# Patient Record
Sex: Male | Born: 1975 | Race: Black or African American | Hispanic: No | Marital: Single | Smoking: Current some day smoker
Health system: Southern US, Community
[De-identification: ages and names within clinical notes are randomized; demographics above are authoritative.]

---

## 2001-05-18 ENCOUNTER — Emergency Department (HOSPITAL_COMMUNITY): Admission: EM | Admit: 2001-05-18 | Discharge: 2001-05-18 | Payer: Self-pay | Admitting: Emergency Medicine

## 2001-09-09 ENCOUNTER — Emergency Department (HOSPITAL_COMMUNITY): Admission: EM | Admit: 2001-09-09 | Discharge: 2001-09-09 | Payer: Self-pay | Admitting: *Deleted

## 2001-09-10 ENCOUNTER — Emergency Department (HOSPITAL_COMMUNITY): Admission: EM | Admit: 2001-09-10 | Discharge: 2001-09-10 | Payer: Self-pay | Admitting: Emergency Medicine

## 2006-05-07 ENCOUNTER — Emergency Department (HOSPITAL_COMMUNITY): Admission: EM | Admit: 2006-05-07 | Discharge: 2006-05-07 | Payer: Self-pay | Admitting: Emergency Medicine

## 2006-10-17 ENCOUNTER — Emergency Department (HOSPITAL_COMMUNITY): Admission: EM | Admit: 2006-10-17 | Discharge: 2006-10-17 | Payer: Self-pay | Admitting: Emergency Medicine

## 2007-04-04 ENCOUNTER — Emergency Department (HOSPITAL_COMMUNITY): Admission: EM | Admit: 2007-04-04 | Discharge: 2007-04-04 | Payer: Self-pay | Admitting: Emergency Medicine

## 2008-10-23 ENCOUNTER — Emergency Department (HOSPITAL_COMMUNITY): Admission: EM | Admit: 2008-10-23 | Discharge: 2008-10-24 | Payer: Self-pay | Admitting: Emergency Medicine

## 2009-12-12 ENCOUNTER — Telehealth: Payer: Self-pay | Admitting: Internal Medicine

## 2010-02-06 NOTE — Progress Notes (Signed)
Summary: NO CALL NO SHOW new pt to establish appt.   Call back at no call no show       Additional Follow-up for Phone Call Additional follow up Details #2::    note sent to Dr. Amador Cunas r/t [facility] new pt to establish .  per him - please charge fee. kik Follow-up by: Duard Brady LPN,  December 12, 2009 2:56 PM

## 2014-12-20 ENCOUNTER — Encounter (HOSPITAL_COMMUNITY): Payer: Self-pay | Admitting: Neurology

## 2014-12-20 ENCOUNTER — Emergency Department (HOSPITAL_COMMUNITY)
Admission: EM | Admit: 2014-12-20 | Discharge: 2014-12-20 | Disposition: A | Discharge status: Home / Self Care | Payer: Self-pay | Attending: Emergency Medicine | Admitting: Emergency Medicine

## 2014-12-20 DIAGNOSIS — Z79899 Other long term (current) drug therapy: Secondary | ICD-10-CM | POA: Insufficient documentation

## 2014-12-20 DIAGNOSIS — R109 Unspecified abdominal pain: Secondary | ICD-10-CM | POA: Insufficient documentation

## 2014-12-20 DIAGNOSIS — F17200 Nicotine dependence, unspecified, uncomplicated: Secondary | ICD-10-CM | POA: Insufficient documentation

## 2014-12-20 LAB — COMPREHENSIVE METABOLIC PANEL
ALBUMIN: 3.9 g/dL (ref 3.5–5.0)
ALT: 26 U/L (ref 17–63)
AST: 34 U/L (ref 15–41)
Alkaline Phosphatase: 70 U/L (ref 38–126)
Anion gap: 10 (ref 5–15)
BUN: 7 mg/dL (ref 6–20)
CHLORIDE: 104 mmol/L (ref 101–111)
CO2: 24 mmol/L (ref 22–32)
CREATININE: 0.85 mg/dL (ref 0.61–1.24)
Calcium: 9.2 mg/dL (ref 8.9–10.3)
GFR calc Af Amer: >60 mL/min (ref >60)
GLUCOSE: 110 mg/dL — AB (ref 65–99)
Potassium: 3.7 mmol/L (ref 3.5–5.1)
Sodium: 138 mmol/L (ref 135–145)
Total Bilirubin: 0.7 mg/dL (ref 0.3–1.2)
Total Protein: 6.8 g/dL (ref 6.5–8.1)

## 2014-12-20 LAB — URINE MICROSCOPIC-ADD ON
Bacteria, UA: NONE SEEN
Squamous Epithelial / LPF: NONE SEEN

## 2014-12-20 LAB — URINALYSIS, ROUTINE W REFLEX MICROSCOPIC
GLUCOSE, UA: NEGATIVE mg/dL
KETONES UR: NEGATIVE mg/dL
Leukocytes, UA: NEGATIVE
Nitrite: NEGATIVE
PH: 5 (ref 5.0–8.0)
PROTEIN: NEGATIVE mg/dL
Specific Gravity, Urine: 1.023 (ref 1.005–1.030)

## 2014-12-20 LAB — CBC
HEMATOCRIT: 41 % (ref 39.0–52.0)
Hemoglobin: 13.6 g/dL (ref 13.0–17.0)
MCH: 29.2 pg (ref 26.0–34.0)
MCHC: 33.2 g/dL (ref 30.0–36.0)
MCV: 88.2 fL (ref 78.0–100.0)
PLATELETS: 218 10*3/uL (ref 150–400)
RBC: 4.65 MIL/uL (ref 4.22–5.81)
RDW: 12 % (ref 11.5–15.5)
WBC: 4.3 10*3/uL (ref 4.0–10.5)

## 2014-12-20 MED ORDER — TAMSULOSIN HCL 0.4 MG PO CAPS: 0.4000 mg | ORAL_CAPSULE | Freq: Two times a day (BID) | ORAL | Status: DC

## 2014-12-20 MED ORDER — HYDROCODONE-ACETAMINOPHEN 5-325 MG PO TABS: 1.0000 | ORAL_TABLET | ORAL | Status: DC | PRN

## 2014-12-20 NOTE — ED Provider Notes (Signed)
CSN: [id number]     Arrival date & time 12/20/14  1014 History   First MD Initiated Contact with Patient 12/20/14 1047     Chief Complaint  Patient presents with  . Flank Pain     (Consider location/radiation/quality/duration/timing/severity/associated sxs/prior Treatment) HPI Donald Cherry is a 39 y.o. male who comes in for evaluation of right flank pain. Patient reports on Saturday he was lying on the driveway, changing the oil in his car when he tried to get up and noticed a mild discomfort in his right flank. He reports it has gradually worsened over the weekend until today. He is not taking anything to improve his symptoms. Symptoms are exacerbated with rotational movements. He denies any fevers, chills, nausea or vomiting, urinary symptoms. No history of kidney stone  History reviewed. No pertinent past medical history. History reviewed. No pertinent past surgical history. No family history on file. Social History  Substance Use Topics  . Smoking status: Current Every Day Smoker  . Smokeless tobacco: None  . Alcohol Use: Yes    Review of Systems A 10 point review of systems was completed and was negative except for pertinent positives and negatives as mentioned in the history of present illness     Allergies  Milk-related compounds  Home Medications   Prior to Admission medications   Medication Sig Start Date End Date Taking? Authorizing Provider  calcium carbonate (TUMS - DOSED IN MG ELEMENTAL CALCIUM) 500 MG chewable tablet Chew 2 tablets by mouth daily.   Yes Historical Provider, MD  HYDROcodone-acetaminophen (NORCO/VICODIN) 5-325 MG tablet Take 1-2 tablets by mouth every 4 (four) hours as needed. 12/20/14   [name], PA-C  tamsulosin (FLOMAX) 0.4 MG CAPS capsule Take 1 capsule (0.4 mg total) by mouth 2 (two) times daily. 12/20/14   Tore Carreker, PA-C   BP 149/104 mmHg  Pulse 76  Temp(Src) 97.7 F (36.5 C) (Oral)  Resp 16  SpO2 100% Physical Exam   Constitutional: He is oriented to person, place, and time. He appears well-developed and well-nourished.  HENT:  Head: Normocephalic and atraumatic.  Mouth/Throat: Oropharynx is clear and moist.  Eyes: Conjunctivae are normal. Pupils are equal, round, and reactive to light. Right eye exhibits no discharge. Left eye exhibits no discharge. No scleral icterus.  Neck: Neck supple.  Cardiovascular: Normal rate, regular rhythm and normal heart sounds.   Pulmonary/Chest: Effort normal and breath sounds normal. No respiratory distress. He has no wheezes. He has no rales.  Abdominal: Soft. There is no tenderness.  Musculoskeletal: He exhibits no tenderness.  No lesions or deformities to right flank. No tenderness to palpation. No crepitus or bony abnormalities. Discomfort reproduced with trunk rotation.  Neurological: He is alert and oriented to person, place, and time.  Cranial Nerves II-XII grossly intact  Skin: Skin is warm and dry. No rash noted.  Psychiatric: He has a normal mood and affect.  Nursing note and vitals reviewed.   ED Course  Procedures (including critical care time) Labs Review Labs Reviewed  COMPREHENSIVE METABOLIC PANEL - Abnormal; Notable for the following:    Glucose, Bld 110 (*)    All other components within normal limits  URINALYSIS, ROUTINE W REFLEX MICROSCOPIC (NOT AT [facility]) - Abnormal; Notable for the following:    Color, Urine AMBER (*)    Hgb urine dipstick TRACE (*)    Bilirubin Urine SMALL (*)    All other components within normal limits  CBC  URINE MICROSCOPIC-ADD ON    Imaging  Review No results found. I have personally reviewed and evaluated these images and lab results as part of my medical decision-making.   EKG Interpretation None     Meds given in ED:  Medications - No data to display  Discharge Medication List as of 12/20/2014 12:35 PM    START taking these medications   Details  HYDROcodone-acetaminophen (NORCO/VICODIN) 5-325 MG  tablet Take 1-2 tablets by mouth every 4 (four) hours as needed., Starting 12/20/2014, Until Discontinued, Print    tamsulosin (FLOMAX) 0.4 MG CAPS capsule Take 1 capsule (0.4 mg total) by mouth 2 (two) times daily., Starting 12/20/2014, Until Discontinued, Print       Filed Vitals:   12/20/14 1031  BP: 149/104  Pulse: 76  Temp: 97.7 F (36.5 C)  TempSrc: Oral  Resp: 16  SpO2: 100%    MDM  Donald Cherry is a 39 y.o. male with no syncope past medical history who comes in for evaluation of right flank pain. Patient thinks he may have injured his right flank while working on his car on the driveway. Discomfort is worse with certain rotational movements. He denies any urinary symptoms. However, urinalysis shows trace hemoglobin. No evidence of UTI or pyelonephritis. Will treat for possible stone versus MSK injury with short course pain medicines. Also given Flomax and instructions to follow-up with PCP next week. Return to ED for any new or worsening symptoms including fevers, chills, nausea or vomiting, worsening pain or urinary symptoms. Patient agrees with this plan.  The patient appears reasonably screened and/or stabilized for discharge and I doubt any other medical condition or other [facility] requiring further screening, evaluation, or treatment in the ED at this time prior to discharge.   Final diagnoses:  Flank pain        [name], PA-C 12/20/14 [place], MD 12/21/14 [phone]

## 2014-12-20 NOTE — ED Notes (Signed)
Pt reports right flank pain since Saturday. Pain when moving or twisting. Denies n/v/d. Denies urinary s/s.

## 2014-12-20 NOTE — Discharge Instructions (Signed)
There does not appear to be an emergent cause for your symptoms. There was some blood in your urine and this may be the result of a kidney stone. There may also be no kidney stone and this may be musculoskeletal in nature. Please take your medications as prescribed. Follow-up with your doctor next week as needed. Return to ED for any new or worsening symptoms.  Flank Pain Flank pain refers to pain that is located on the side of the body between the upper abdomen and the back. The pain may occur over a short period of time (acute) or may be long-term or reoccurring (chronic). It may be mild or severe. Flank pain can be caused by many things. CAUSES  Some of the more common causes of flank pain include:  Muscle strains.   Muscle spasms.   A disease of your spine (vertebral disk disease).   A lung infection (pneumonia).   Fluid around your lungs (pulmonary edema).   A kidney infection.   Kidney stones.   A very painful skin rash caused by the chickenpox virus (shingles).   Gallbladder disease.  [place] care will depend on the cause of your pain. In general,  Rest as directed by your caregiver.  Drink enough fluids to keep your urine clear or pale yellow.  Only take over-the-counter or prescription medicines as directed by your caregiver. Some medicines may help relieve the pain.  Tell your caregiver about any changes in your pain.  Follow up with your caregiver as directed. SEEK IMMEDIATE MEDICAL CARE IF:   Your pain is not controlled with medicine.   You have new or worsening symptoms.  Your pain increases.   You have abdominal pain.   You have shortness of breath.   You have persistent nausea or vomiting.   You have swelling in your abdomen.   You feel faint or pass out.   You have blood in your urine.  You have a fever or persistent symptoms for more than 2-3 days.  You have a fever and your symptoms suddenly get  worse. MAKE SURE YOU:   Understand these instructions.  Will watch your condition.  Will get help right away if you are not doing well or get worse.   This information is not intended to replace advice given to you by your health care provider. Make sure you discuss any questions you have with your health care provider.   Document Released: 02/14/2005 Document Revised: 09/18/2011 Document Reviewed: 08/08/2011 Elsevier Interactive Patient Education [employer].

## 2015-10-23 ENCOUNTER — Emergency Department (HOSPITAL_COMMUNITY): Payer: Managed Care, Other (non HMO)

## 2015-10-23 ENCOUNTER — Emergency Department (HOSPITAL_COMMUNITY)
Admission: EM | Admit: 2015-10-23 | Discharge: 2015-10-23 | Disposition: A | Discharge status: Home / Self Care | Payer: Managed Care, Other (non HMO) | Attending: Emergency Medicine | Admitting: Emergency Medicine

## 2015-10-23 ENCOUNTER — Encounter (HOSPITAL_COMMUNITY): Payer: Self-pay | Admitting: Emergency Medicine

## 2015-10-23 DIAGNOSIS — F17200 Nicotine dependence, unspecified, uncomplicated: Secondary | ICD-10-CM | POA: Diagnosis not present

## 2015-10-23 DIAGNOSIS — B9789 Other viral agents as the cause of diseases classified elsewhere: Secondary | ICD-10-CM

## 2015-10-23 DIAGNOSIS — R062 Wheezing: Secondary | ICD-10-CM

## 2015-10-23 DIAGNOSIS — R05 Cough: Secondary | ICD-10-CM | POA: Diagnosis present

## 2015-10-23 DIAGNOSIS — J069 Acute upper respiratory infection, unspecified: Secondary | ICD-10-CM | POA: Insufficient documentation

## 2015-10-23 LAB — CBC WITH DIFFERENTIAL/PLATELET
BASOS PCT: 0 %
Basophils Absolute: 0 10*3/uL (ref 0.0–0.1)
EOS ABS: 0 10*3/uL (ref 0.0–0.7)
EOS PCT: 0 %
HCT: 43.2 % (ref 39.0–52.0)
HEMOGLOBIN: 14.9 g/dL (ref 13.0–17.0)
LYMPHS ABS: 1.8 10*3/uL (ref 0.7–4.0)
Lymphocytes Relative: 11 %
MCH: 29.8 pg (ref 26.0–34.0)
MCHC: 34.5 g/dL (ref 30.0–36.0)
MCV: 86.4 fL (ref 78.0–100.0)
MONOS PCT: 10 %
Monocytes Absolute: 1.6 10*3/uL — ABNORMAL HIGH (ref 0.1–1.0)
NEUTROS PCT: 79 %
Neutro Abs: 12.9 10*3/uL — ABNORMAL HIGH (ref 1.7–7.7)
PLATELETS: 251 10*3/uL (ref 150–400)
RBC: 5 MIL/uL (ref 4.22–5.81)
RDW: 12.2 % (ref 11.5–15.5)
WBC: 16.3 10*3/uL — ABNORMAL HIGH (ref 4.0–10.5)

## 2015-10-23 LAB — COMPREHENSIVE METABOLIC PANEL
ALBUMIN: 3.9 g/dL (ref 3.5–5.0)
ALK PHOS: 103 U/L (ref 38–126)
ALT: 36 U/L (ref 17–63)
ANION GAP: 11 (ref 5–15)
AST: 46 U/L — ABNORMAL HIGH (ref 15–41)
BUN: 8 mg/dL (ref 6–20)
CALCIUM: 9.6 mg/dL (ref 8.9–10.3)
CHLORIDE: 100 mmol/L — AB (ref 101–111)
CO2: 24 mmol/L (ref 22–32)
Creatinine, Ser: 0.94 mg/dL (ref 0.61–1.24)
GFR calc non Af Amer: >60 mL/min (ref >60)
GLUCOSE: 135 mg/dL — AB (ref 65–99)
POTASSIUM: 3.9 mmol/L (ref 3.5–5.1)
SODIUM: 135 mmol/L (ref 135–145)
Total Bilirubin: 1 mg/dL (ref 0.3–1.2)
Total Protein: 8 g/dL (ref 6.5–8.1)

## 2015-10-23 MED ORDER — AEROCHAMBER PLUS W/MASK MISC
1.0000 | Freq: Once | Status: DC
  Filled 2015-10-23 (×3): qty 1

## 2015-10-23 MED ORDER — DM-GUAIFENESIN ER 30-600 MG PO TB12
1.0000 | ORAL_TABLET | Freq: Two times a day (BID) | ORAL | Status: DC
Start: 2015-10-23 — End: 2015-10-24
  Administered 2015-10-23: 1 via ORAL
  Filled 2015-10-23: qty 1

## 2015-10-23 MED ORDER — PREDNISONE 20 MG PO TABS
60.0000 mg | ORAL_TABLET | Freq: Once | ORAL | Status: AC
  Administered 2015-10-23: 60 mg via ORAL
  Filled 2015-10-23: qty 3

## 2015-10-23 MED ORDER — IPRATROPIUM BROMIDE 0.02 % IN SOLN
0.5000 mg | Freq: Once | RESPIRATORY_TRACT | Status: AC
  Administered 2015-10-23: 0.5 mg via RESPIRATORY_TRACT
  Filled 2015-10-23: qty 2.5

## 2015-10-23 MED ORDER — ALBUTEROL SULFATE HFA 108 (90 BASE) MCG/ACT IN AERS
2.0000 | INHALATION_SPRAY | Freq: Once | RESPIRATORY_TRACT | Status: AC
  Administered 2015-10-23: 2 via RESPIRATORY_TRACT
  Filled 2015-10-23: qty 6.7

## 2015-10-23 MED ORDER — ALBUTEROL SULFATE (2.5 MG/3ML) 0.083% IN NEBU
5.0000 mg | INHALATION_SOLUTION | Freq: Once | RESPIRATORY_TRACT | Status: AC
  Administered 2015-10-23: 5 mg via RESPIRATORY_TRACT
  Filled 2015-10-23: qty 6

## 2015-10-23 MED ORDER — PREDNISONE 20 MG PO TABS: 40.0000 mg | ORAL_TABLET | Freq: Every day | ORAL | 0 refills | Status: DC

## 2015-10-23 MED ORDER — DEXTROMETHORPHAN-GUAIFENESIN 15-400 MG PO TABS: 1.0000 | ORAL_TABLET | ORAL | 0 refills | Status: DC | PRN

## 2015-10-23 NOTE — Discharge Instructions (Signed)
You appear to have an upper respiratory infection (URI). An upper respiratory tract infection, or cold, is a viral infection of the air passages leading to the lungs. It is contagious and can be spread to others, especially during the first 3 or 4 days. It cannot be cured by antibiotics or other medicines. RETURN IMMEDIATELY IF you develop shortness of breath, confusion or altered mental status, a new rash, become dizzy, faint, or poorly responsive, or are unable to be cared for at home.

## 2015-10-23 NOTE — ED Triage Notes (Signed)
Pt c/o cold symptoms with productive cough x's 3 days.  Also c/o chills and body aches.

## 2015-10-23 NOTE — ED Provider Notes (Signed)
[facility] DEPT Provider Note   CSN: [id number] Arrival date & time: 10/23/15  1734     History   Chief Complaint Chief Complaint  Patient presents with  . Cough    HPI Donald Cherry is a 40 y.o. male who presents emergency belly chief complaint of cough and flulike symptoms. He has had 3 days of worsening productive cough, myalgias, headaches, and generalized malaise. He has a history of asthma. Patient states that he quit smoking cigarettes when his 79-1/2-year-old son was born. His wife still currently smokes cigarettes daily around him and the child. The patient complains of wheezing and tightness. He has not had an inhaler to use at home. He has been using over-the-counter Robitussin and DayQuil with improvement in his symptoms, that was off when the medication runs out.  . Denies dysuria, flank pain, suprapubic pain, frequency, urgency, or hematuria. Denies headaches, light headedness, visual disturbances. Denies abdominal pain, nausea, vomiting, diarrhea or constipation.   HPI  History reviewed. No pertinent past medical history.  There are no active problems to display for this patient.   History reviewed. No pertinent surgical history.     Home Medications    Prior to Admission medications   Medication Sig Start Date End Date Taking? Authorizing Provider  calcium carbonate (TUMS - DOSED IN MG ELEMENTAL CALCIUM) 500 MG chewable tablet Chew 2 tablets by mouth daily.    Historical Provider, MD  Dextromethorphan-Guaifenesin 15-400 MG TABS Take 1 tablet by mouth every 4 (four) hours as needed. 10/23/15   [name], PA-C  HYDROcodone-acetaminophen (NORCO/VICODIN) 5-325 MG tablet Take 1-2 tablets by mouth every 4 (four) hours as needed. 12/20/14   [name], PA-C  predniSONE (DELTASONE) 20 MG tablet Take 2 tablets (40 mg total) by mouth daily. 10/23/15   [name], PA-C  tamsulosin (FLOMAX) 0.4 MG CAPS capsule Take 1 capsule (0.4 mg total) by mouth 2  (two) times daily. 12/20/14   [name], PA-C    Family History No family history on file.  Social History Social History  Substance Use Topics  . Smoking status: Current Every Day Smoker  . Smokeless tobacco: Never Used  . Alcohol use Yes     Allergies   Milk-related compounds   Review of Systems Review of Systems   Physical Exam Updated Vital Signs BP 126/88 (BP Location: Right Arm)   Pulse (!) 130   Temp 98.9 F (37.2 C) (Oral)   Resp 14   Ht 6' 3"  (1.905 m)   Wt 70.3 kg   SpO2 94%   BMI 19.37 kg/m   Physical Exam  Constitutional: He appears well-developed and well-nourished. No distress.  HENT:  Head: Normocephalic and atraumatic.  Right Ear: Tympanic membrane normal.  Left Ear: Tympanic membrane normal.  Nose: Mucosal edema present.  Mouth/Throat: Uvula is midline and oropharynx is clear and moist.  Eyes: Conjunctivae are normal. No scleral icterus.  Neck: Normal range of motion. Neck supple.  Cardiovascular: Normal rate, regular rhythm and normal heart sounds.   Pulmonary/Chest: Effort normal. No respiratory distress. He has wheezes.  Abdominal: Soft. There is no tenderness.  Musculoskeletal: He exhibits no edema.  Neurological: He is alert.  Skin: Skin is warm and dry. He is not diaphoretic.  Psychiatric: His behavior is normal.  Nursing note and vitals reviewed.    ED Treatments / Results  Labs (all labs ordered are listed, but only abnormal results are displayed) Labs Reviewed  CBC WITH DIFFERENTIAL/PLATELET - Abnormal; Notable for the  following:       Result Value   WBC 16.3 (*)    Neutro Abs 12.9 (*)    Monocytes Absolute 1.6 (*)    All other components within normal limits  COMPREHENSIVE METABOLIC PANEL - Abnormal; Notable for the following:    Chloride 100 (*)    Glucose, Bld 135 (*)    AST 46 (*)    All other components within normal limits    EKG  EKG Interpretation None       Radiology No results  found.  Procedures Procedures (including critical care time)  Medications Ordered in ED Medications  albuterol (PROVENTIL) (2.5 MG/3ML) 0.083% nebulizer solution 5 mg (5 mg Nebulization Given 10/23/15 2015)  ipratropium (ATROVENT) nebulizer solution 0.5 mg (0.5 mg Nebulization Given 10/23/15 2015)  predniSONE (DELTASONE) tablet 60 mg (60 mg Oral Given 10/23/15 2015)  albuterol (PROVENTIL HFA;VENTOLIN HFA) 108 (90 Base) MCG/ACT inhaler 2 puff (2 puffs Inhalation Given 10/23/15 2150)     Initial Impression / Assessment and Plan / ED Course  I have reviewed the triage vital signs and the nursing notes.  Pertinent labs & imaging results that were available during my care of the patient were reviewed by me and considered in my medical decision making (see chart for details).  Clinical Course    Pt CXR negative for acute infiltrate. Patients symptoms are consistent with URI, likely viral etiology. Discussed that antibiotics are not indicated for viral infections. Pt will be discharged with symptomatic treatment.  Verbalizes understanding and is agreeable with plan. Pt is hemodynamically stable & in NAD prior to dc.   Final Clinical Impressions(s) / ED Diagnoses   Final diagnoses:  Viral URI with cough  Wheezing    New Prescriptions Discharge Medication List as of 10/23/2015  9:23 PM    START taking these medications   Details  Dextromethorphan-Guaifenesin 15-400 MG TABS Take 1 tablet by mouth every 4 (four) hours as needed., Starting Mon 10/23/2015, Print    predniSONE (DELTASONE) 20 MG tablet Take 2 tablets (40 mg total) by mouth daily., Starting Mon 10/23/2015, Print         [place], PA-C 10/26/15 [place], MD 11/03/15 [phone]

## 2016-02-08 ENCOUNTER — Encounter: Payer: Self-pay | Admitting: Physician Assistant

## 2016-02-08 ENCOUNTER — Ambulatory Visit: Payer: Managed Care, Other (non HMO) | Admitting: Physician Assistant

## 2016-05-16 ENCOUNTER — Encounter (HOSPITAL_COMMUNITY): Payer: Self-pay | Admitting: *Deleted

## 2016-05-16 ENCOUNTER — Emergency Department (HOSPITAL_COMMUNITY)
Admission: EM | Admit: 2016-05-16 | Discharge: 2016-05-16 | Disposition: A | Discharge status: Home / Self Care | Payer: 59 | Attending: Emergency Medicine | Admitting: Emergency Medicine

## 2016-05-16 ENCOUNTER — Emergency Department (HOSPITAL_COMMUNITY): Payer: 59

## 2016-05-16 DIAGNOSIS — F17200 Nicotine dependence, unspecified, uncomplicated: Secondary | ICD-10-CM | POA: Insufficient documentation

## 2016-05-16 DIAGNOSIS — Y999 Unspecified external cause status: Secondary | ICD-10-CM | POA: Insufficient documentation

## 2016-05-16 DIAGNOSIS — S0501XA Injury of conjunctiva and corneal abrasion without foreign body, right eye, initial encounter: Secondary | ICD-10-CM | POA: Diagnosis not present

## 2016-05-16 DIAGNOSIS — Z79899 Other long term (current) drug therapy: Secondary | ICD-10-CM | POA: Diagnosis not present

## 2016-05-16 DIAGNOSIS — Y929 Unspecified place or not applicable: Secondary | ICD-10-CM | POA: Insufficient documentation

## 2016-05-16 DIAGNOSIS — R51 Headache: Secondary | ICD-10-CM | POA: Diagnosis not present

## 2016-05-16 DIAGNOSIS — Y9389 Activity, other specified: Secondary | ICD-10-CM | POA: Diagnosis not present

## 2016-05-16 LAB — CBC
HEMATOCRIT: 40.2 % (ref 39.0–52.0)
Hemoglobin: 13.4 g/dL (ref 13.0–17.0)
MCH: 29.2 pg (ref 26.0–34.0)
MCHC: 33.3 g/dL (ref 30.0–36.0)
MCV: 87.6 fL (ref 78.0–100.0)
PLATELETS: 214 10*3/uL (ref 150–400)
RBC: 4.59 MIL/uL (ref 4.22–5.81)
RDW: 12.5 % (ref 11.5–15.5)
WBC: 6.9 10*3/uL (ref 4.0–10.5)

## 2016-05-16 LAB — COMPREHENSIVE METABOLIC PANEL
ALK PHOS: 58 U/L (ref 38–126)
ALT: 26 U/L (ref 17–63)
AST: 36 U/L (ref 15–41)
Albumin: 3.9 g/dL (ref 3.5–5.0)
Anion gap: 8 (ref 5–15)
BILIRUBIN TOTAL: 0.6 mg/dL (ref 0.3–1.2)
BUN: 9 mg/dL (ref 6–20)
CO2: 26 mmol/L (ref 22–32)
CREATININE: 0.77 mg/dL (ref 0.61–1.24)
Calcium: 9 mg/dL (ref 8.9–10.3)
Chloride: 104 mmol/L (ref 101–111)
GFR calc Af Amer: >60 mL/min (ref >60)
GLUCOSE: 88 mg/dL (ref 65–99)
POTASSIUM: 4.1 mmol/L (ref 3.5–5.1)
Sodium: 138 mmol/L (ref 135–145)
TOTAL PROTEIN: 6.7 g/dL (ref 6.5–8.1)

## 2016-05-16 MED ORDER — FLUORESCEIN SODIUM 0.6 MG OP STRP
1.0000 | ORAL_STRIP | Freq: Once | OPHTHALMIC | Status: AC
  Administered 2016-05-16: 1 via OPHTHALMIC
  Filled 2016-05-16: qty 1

## 2016-05-16 MED ORDER — TETRACAINE HCL 0.5 % OP SOLN
2.0000 [drp] | Freq: Once | OPHTHALMIC | Status: AC
  Administered 2016-05-16: 2 [drp] via OPHTHALMIC
  Filled 2016-05-16: qty 2

## 2016-05-16 MED ORDER — ERYTHROMYCIN 5 MG/GM OP OINT: TOPICAL_OINTMENT | OPHTHALMIC | 0 refills | Status: DC
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
## 2016-05-16 NOTE — Discharge Instructions (Signed)
Please follow up with ophthalmology, Dr. Alanda Slim, tomorrow morning regarding today's visit. Take ibuprofen or naproxen as needed for any pain. Please use erythromycin opthalmic ointment 4 times a day 5 days.   Get help right away if: You have severe eye pain that does not get better with medicine. You have vision loss.

## 2016-05-16 NOTE — ED Notes (Signed)
Checked patient eyes patient was 20/20 both eyes 20/20 left, 20/20 right eye

## 2017-07-08 ENCOUNTER — Encounter (HOSPITAL_COMMUNITY): Payer: Self-pay

## 2017-07-08 ENCOUNTER — Other Ambulatory Visit: Payer: Self-pay

## 2017-07-08 ENCOUNTER — Emergency Department (HOSPITAL_COMMUNITY): Payer: 59

## 2017-07-08 ENCOUNTER — Emergency Department (HOSPITAL_COMMUNITY)
Admission: EM | Admit: 2017-07-08 | Discharge: 2017-07-08 | Disposition: A | Discharge status: Home / Self Care | Payer: 59 | Attending: Emergency Medicine | Admitting: Emergency Medicine

## 2017-07-08 DIAGNOSIS — B9789 Other viral agents as the cause of diseases classified elsewhere: Secondary | ICD-10-CM | POA: Diagnosis not present

## 2017-07-08 DIAGNOSIS — F17200 Nicotine dependence, unspecified, uncomplicated: Secondary | ICD-10-CM | POA: Insufficient documentation

## 2017-07-08 DIAGNOSIS — J029 Acute pharyngitis, unspecified: Secondary | ICD-10-CM | POA: Insufficient documentation

## 2017-07-08 DIAGNOSIS — M542 Cervicalgia: Secondary | ICD-10-CM | POA: Diagnosis present

## 2017-07-08 LAB — GROUP A STREP BY PCR: GROUP A STREP BY PCR: NOT DETECTED

## 2017-07-08 MED ORDER — IBUPROFEN 800 MG PO TABS: 800.0000 mg | ORAL_TABLET | Freq: Three times a day (TID) | ORAL | 0 refills | Status: DC | PRN

## 2017-07-08 MED ORDER — DEXAMETHASONE SODIUM PHOSPHATE 10 MG/ML IJ SOLN
10.0000 mg | Freq: Once | INTRAMUSCULAR | Status: AC
  Administered 2017-07-08: 10 mg via INTRAMUSCULAR
  Filled 2017-07-08: qty 1

## 2017-07-08 MED ORDER — ACETAMINOPHEN 325 MG PO TABS
650.0000 mg | ORAL_TABLET | Freq: Once | ORAL | Status: AC
  Administered 2017-07-08: 650 mg via ORAL
  Filled 2017-07-08: qty 2

## 2017-07-08 NOTE — ED Triage Notes (Signed)
Pt complaining of lump to right side of throat x2 days. Pt reports hx of similar episodes. Pt denies food allergies. Pt denies any breathing difficulties. Pt endorses 7/10 headache.

## 2017-07-08 NOTE — ED Provider Notes (Signed)
Emergency Department Provider Note   I have reviewed the triage vital signs and the nursing notes.   HISTORY  Chief Complaint No chief complaint on file.   HPI Donald Cherry is a 42 y.o. male presents to the emergency department for evaluation of right-sided neck pain and painful swallowing.  Patient states he has had some symptoms in the distant past which were similar but cannot recall the ultimate diagnosis or treatment at that time.  He denies any fevers or chills.  He has had some headaches which have been intermittent over the past several days.  He has pain with swallowing but no difficulty swallowing liquids or solids.  No vomiting.  Denies any shortness of breath.  Denies chest pain.  No radiation of symptoms.   History reviewed. No pertinent past medical history.  There are no active problems to display for this patient.   History reviewed. No pertinent surgical history.   Allergies Milk-related compounds  No family history on file.  Social History Social History   Tobacco Use  . Smoking status: Current Every Day Smoker  . Smokeless tobacco: Never Used  Substance Use Topics  . Alcohol use: Yes  . Drug use: No    Review of Systems  Constitutional: No fever/chills Eyes: No visual changes. ENT: Positive sore throat. Cardiovascular: Denies chest pain. Respiratory: Denies shortness of breath. Gastrointestinal: No abdominal pain.  No nausea, no vomiting.  No diarrhea.  No constipation. Genitourinary: Negative for dysuria. Musculoskeletal: Negative for back pain. Skin: Negative for rash. Neurological: Negative for focal weakness or numbness. Positive intermittent HA.   10-point ROS otherwise negative.  ____________________________________________   PHYSICAL EXAM:  VITAL SIGNS: ED Triage Vitals  Enc Vitals Group     BP 07/08/17 0555 (!) 147/87     Pulse Rate 07/08/17 0555 68     Resp 07/08/17 0555 17     Temp 07/08/17 0555 98.2 F (36.8 C)     Temp src --      SpO2 07/08/17 0555 100 %     Weight 07/08/17 0600 165 lb (74.8 kg)     Height 07/08/17 0600 6' 4"  (1.93 m)     Pain Score 07/08/17 0600 7   Constitutional: Alert and oriented. Well appearing and in no acute distress. Eyes: Conjunctivae are normal. Head: Atraumatic. Nose: No congestion/rhinnorhea. Mouth/Throat: Mucous membranes are moist.  Oropharynx with diffuse erythema. No exudate. No PTA. Managing oral secretions and speaking in a clear voice.  Neck: No stridor.  No meningeal signs.  Cardiovascular: Normal rate, regular rhythm. Good peripheral circulation. Grossly normal heart sounds.   Respiratory: Normal respiratory effort.  No retractions. Lungs CTAB. Gastrointestinal: No distention.  Musculoskeletal: No gross deformities of extremities. Neurologic:  Normal speech and language.  Skin:  Skin is warm, dry and intact. No rash noted.  ____________________________________________   LABS (all labs ordered are listed, but only abnormal results are displayed)  Labs Reviewed  GROUP A STREP BY PCR   ____________________________________________  RADIOLOGY  Dg Neck Soft Tissue  Result Date: 07/08/2017 CLINICAL DATA:  42 year old male with right-sided neck lump and pain. EXAM: NECK SOFT TISSUES - 1+ VIEW COMPARISON:  None. FINDINGS: There is no evidence of retropharyngeal soft tissue swelling or epiglottic enlargement. The cervical airway is unremarkable and no radio-opaque foreign body identified. Carotid bulb calcifications noted bilaterally. IMPRESSION: 1. No acute findings. 2. Bilateral carotid bulb calcifications. Electronically Signed   By: Anner Crete M.D.   On: 07/08/2017 06:34  ____________________________________________   PROCEDURES  Procedure(s) performed:   Procedures  None ____________________________________________   INITIAL IMPRESSION / ASSESSMENT AND PLAN / ED COURSE  Pertinent labs & imaging results that were available during my  care of the patient were reviewed by me and considered in my medical decision making (see chart for details).  Patient presents to the emergency department for evaluation of sore throat worse on the right side with painful swallowing.  He has diffuse erythema without exudate.  No peritonsillar abscess.  Given his unilateral symptoms plan for plain film of the neck but low suspicion for deeper space masses or lesions.  Plan to obtain strep PCR. Wound consider steroids if negative.   Strep negative. Will discharge.   At this time, I do not feel there is any life-threatening condition present. I have reviewed and discussed all results (EKG, imaging, lab, urine as appropriate), exam findings with patient. I have reviewed nursing notes and appropriate previous records.  I feel the patient is safe to be discharged home without further emergent workup. Discussed usual and customary return precautions. Patient and family (if present) verbalize understanding and are comfortable with this plan.  Patient will follow-up with their primary care provider. If they do not have a primary care provider, information for follow-up has been provided to them. All questions have been answered.   ____________________________________________  FINAL CLINICAL IMPRESSION(S) / ED DIAGNOSES  Final diagnoses:  Viral pharyngitis     MEDICATIONS GIVEN DURING THIS VISIT:  Medications  dexamethasone (DECADRON) injection 10 mg (10 mg Intramuscular Given 07/08/17 0745)  acetaminophen (TYLENOL) tablet 650 mg (650 mg Oral Given 07/08/17 0755)     NEW OUTPATIENT MEDICATIONS STARTED DURING THIS VISIT:  Discharge Medication List as of 07/08/2017  7:50 AM    START taking these medications   Details  ibuprofen (ADVIL,MOTRIN) 800 MG tablet Take 1 tablet (800 mg total) by mouth every 8 (eight) hours as needed., Starting Tue 07/08/2017, Print        Note:  This document was prepared using Dragon voice recognition software and may  include unintentional dictation errors.  Nanda Quinton, MD Emergency Medicine    Everli Rother, [facility], MD 07/08/17 [phone]

## 2017-07-08 NOTE — Discharge Instructions (Signed)
You were seen in the ED today with sore throat and headache. This is probably due to a virus. Take the motrin as needed for pain and drink plenty of cool fluids. Call the PCP listed to establish care. Return to the ED with any new or worsening symptoms.

## 2017-07-08 NOTE — ED Notes (Signed)
Pt states he understands instructions. Home stable with steady gait.

## 2020-11-27 ENCOUNTER — Emergency Department (HOSPITAL_COMMUNITY)
Admission: EM | Admit: 2020-11-27 | Discharge: 2020-11-27 | Disposition: A | Discharge status: Home / Self Care | Payer: BC Managed Care – PPO | Attending: Emergency Medicine | Admitting: Emergency Medicine

## 2020-11-27 ENCOUNTER — Emergency Department (HOSPITAL_COMMUNITY): Payer: BC Managed Care – PPO

## 2020-11-27 ENCOUNTER — Other Ambulatory Visit: Payer: Self-pay

## 2020-11-27 ENCOUNTER — Encounter (HOSPITAL_COMMUNITY): Payer: Self-pay

## 2020-11-27 DIAGNOSIS — S20219A Contusion of unspecified front wall of thorax, initial encounter: Secondary | ICD-10-CM | POA: Diagnosis not present

## 2020-11-27 DIAGNOSIS — S161XXA Strain of muscle, fascia and tendon at neck level, initial encounter: Secondary | ICD-10-CM | POA: Diagnosis not present

## 2020-11-27 DIAGNOSIS — F17210 Nicotine dependence, cigarettes, uncomplicated: Secondary | ICD-10-CM | POA: Insufficient documentation

## 2020-11-27 DIAGNOSIS — Y92410 Unspecified street and highway as the place of occurrence of the external cause: Secondary | ICD-10-CM | POA: Insufficient documentation

## 2020-11-27 DIAGNOSIS — Z79899 Other long term (current) drug therapy: Secondary | ICD-10-CM | POA: Insufficient documentation

## 2020-11-27 DIAGNOSIS — S3991XA Unspecified injury of abdomen, initial encounter: Secondary | ICD-10-CM | POA: Insufficient documentation

## 2020-11-27 DIAGNOSIS — M545 Low back pain, unspecified: Secondary | ICD-10-CM | POA: Diagnosis not present

## 2020-11-27 DIAGNOSIS — R519 Headache, unspecified: Secondary | ICD-10-CM | POA: Diagnosis not present

## 2020-11-27 DIAGNOSIS — S299XXA Unspecified injury of thorax, initial encounter: Secondary | ICD-10-CM | POA: Diagnosis present

## 2020-11-27 LAB — CBC WITH DIFFERENTIAL/PLATELET
Abs Immature Granulocytes: 0.01 10*3/uL (ref 0.00–0.07)
Basophils Absolute: 0 10*3/uL (ref 0.0–0.1)
Basophils Relative: 0 %
Eosinophils Absolute: 0.1 10*3/uL (ref 0.0–0.5)
Eosinophils Relative: 1 %
HCT: 44.7 % (ref 39.0–52.0)
Hemoglobin: 15.1 g/dL (ref 13.0–17.0)
Immature Granulocytes: 0 %
Lymphocytes Relative: 31 %
Lymphs Abs: 2.4 10*3/uL (ref 0.7–4.0)
MCH: 29.6 pg (ref 26.0–34.0)
MCHC: 33.8 g/dL (ref 30.0–36.0)
MCV: 87.6 fL (ref 80.0–100.0)
Monocytes Absolute: 0.6 10*3/uL (ref 0.1–1.0)
Monocytes Relative: 8 %
Neutro Abs: 4.7 10*3/uL (ref 1.7–7.7)
Neutrophils Relative %: 60 %
Platelets: 283 10*3/uL (ref 150–400)
RBC: 5.1 MIL/uL (ref 4.22–5.81)
RDW: 12.1 % (ref 11.5–15.5)
WBC: 7.7 10*3/uL (ref 4.0–10.5)
nRBC: 0 % (ref 0.0–0.2)

## 2020-11-27 LAB — COMPREHENSIVE METABOLIC PANEL
ALT: 47 U/L — ABNORMAL HIGH (ref 0–44)
AST: 42 U/L — ABNORMAL HIGH (ref 15–41)
Albumin: 4.6 g/dL (ref 3.5–5.0)
Alkaline Phosphatase: 75 U/L (ref 38–126)
Anion gap: 7 (ref 5–15)
BUN: 11 mg/dL (ref 6–20)
CO2: 25 mmol/L (ref 22–32)
Calcium: 9.2 mg/dL (ref 8.9–10.3)
Chloride: 106 mmol/L (ref 98–111)
Creatinine, Ser: 0.7 mg/dL (ref 0.61–1.24)
GFR, Estimated: >60 mL/min (ref >60)
Glucose, Bld: 124 mg/dL — ABNORMAL HIGH (ref 70–99)
Potassium: 4 mmol/L (ref 3.5–5.1)
Sodium: 138 mmol/L (ref 135–145)
Total Bilirubin: 0.8 mg/dL (ref 0.3–1.2)
Total Protein: 8 g/dL (ref 6.5–8.1)

## 2020-11-27 MED ORDER — IOHEXOL 350 MG/ML SOLN
80.0000 mL | Freq: Once | INTRAVENOUS | Status: AC | PRN
  Administered 2020-11-27: 80 mL via INTRAVENOUS

## 2020-11-27 MED ORDER — METHOCARBAMOL 500 MG PO TABS: 500.0000 mg | ORAL_TABLET | Freq: Three times a day (TID) | ORAL | 0 refills | Status: DC | PRN

## 2020-11-27 NOTE — ED Notes (Signed)
Pt back from CT

## 2020-11-27 NOTE — Discharge Instructions (Addendum)
The muscle relaxers may help with the pain.  Also Tylenol or Motrin may help.  Your blood pressure is elevated today.  Needs following.
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
## 2020-12-04 ENCOUNTER — Ambulatory Visit: Payer: 59 | Attending: Physician Assistant | Admitting: Physician Assistant

## 2020-12-04 ENCOUNTER — Other Ambulatory Visit: Payer: Self-pay

## 2020-12-04 ENCOUNTER — Encounter: Payer: Self-pay | Admitting: Physician Assistant

## 2020-12-04 DIAGNOSIS — M546 Pain in thoracic spine: Secondary | ICD-10-CM

## 2020-12-04 DIAGNOSIS — Z6820 Body mass index (BMI) 20.0-20.9, adult: Secondary | ICD-10-CM

## 2020-12-04 DIAGNOSIS — M545 Low back pain, unspecified: Secondary | ICD-10-CM

## 2020-12-04 DIAGNOSIS — M542 Cervicalgia: Secondary | ICD-10-CM

## 2020-12-04 DIAGNOSIS — Z87891 Personal history of nicotine dependence: Secondary | ICD-10-CM | POA: Insufficient documentation

## 2020-12-04 DIAGNOSIS — K219 Gastro-esophageal reflux disease without esophagitis: Secondary | ICD-10-CM | POA: Insufficient documentation

## 2020-12-04 DIAGNOSIS — I700 Atherosclerosis of aorta: Secondary | ICD-10-CM

## 2020-12-04 DIAGNOSIS — J439 Emphysema, unspecified: Secondary | ICD-10-CM | POA: Insufficient documentation

## 2020-12-04 DIAGNOSIS — R030 Elevated blood-pressure reading, without diagnosis of hypertension: Secondary | ICD-10-CM

## 2020-12-04 MED ORDER — MELOXICAM 7.5 MG PO TABS: 7.5000 mg | ORAL_TABLET | Freq: Two times a day (BID) | ORAL | 0 refills | Status: DC | PRN

## 2020-12-04 MED ORDER — OMEPRAZOLE 20 MG PO CPDR
20.0000 mg | DELAYED_RELEASE_CAPSULE | Freq: Every day | ORAL | 3 refills | Status: DC
Start: 2020-12-04 — End: 2021-04-05

## 2020-12-04 MED ORDER — CYCLOBENZAPRINE HCL 10 MG PO TABS: 10.0000 mg | ORAL_TABLET | Freq: Three times a day (TID) | ORAL | 0 refills | Status: DC | PRN
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
## 2020-12-04 NOTE — Patient Instructions (Signed)
To help with your pain, you will start taking Mobic once daily in the morning, you may take a second dose in the evening if needed.  You will take Prilosec once daily in the morning to help protect your stomach.  You will use Flexeril 3 times a day as needed to help with your pain.  I encourage you to alternate between heat and ice and do gentle stretching as tolerated.  Your blood pressure is elevated today, I encourage you to check your blood pressure at home on a daily basis, keep a written log and bring that with you to your next office visit in 1 week.  Please plan on having your fasting labs completed at your next office visit.  Donald Rad, PA-C Physician Assistant [facility] [email or web address]  Acute Back Pain, Adult Acute back pain is sudden and usually short-lived. It is often caused by an injury to the muscles and tissues in the back. The injury may result from: A muscle, tendon, or ligament getting overstretched or torn. Ligaments are tissues that connect bones to each other. Lifting something improperly can cause a back strain. Wear and tear (degeneration) of the spinal disks. Spinal disks are circular tissue that provide cushioning between the bones of the spine (vertebrae). Twisting motions, such as while playing sports or doing yard work. A hit to the back. Arthritis. You may have a physical exam, lab tests, and imaging tests to find the cause of your pain. Acute back pain usually goes away with rest and home care. Follow these instructions at home: Managing pain, stiffness, and swelling Take over-the-counter and prescription medicines only as told by your health care provider. Treatment may include medicines for pain and inflammation that are taken by mouth or applied to the skin, or muscle relaxants. Your health care provider may recommend applying ice during the first 24-48 hours after your pain starts. To do  this: Put ice in a plastic bag. Place a towel between your skin and the bag. Leave the ice on for 20 minutes, 2-3 times a day. Remove the ice if your skin turns bright red. This is very important. If you cannot feel pain, heat, or cold, you have a greater risk of damage to the area. If directed, apply heat to the affected area as often as told by your health care provider. Use the heat source that your health care provider recommends, such as a moist heat pack or a heating pad. Place a towel between your skin and the heat source. Leave the heat on for 20-30 minutes. Remove the heat if your skin turns bright red. This is especially important if you are unable to feel pain, heat, or cold. You have a greater risk of getting burned. Activity  Do not stay in bed. Staying in bed for more than 1-2 days can delay your recovery. Sit up and stand up straight. Avoid leaning forward when you sit or hunching over when you stand. If you work at a desk, sit close to it so you do not need to lean over. Keep your chin tucked in. Keep your neck drawn back, and keep your elbows bent at a 90-degree angle (right angle). Sit high and close to the steering wheel when you drive. Add lower back (lumbar) support to your car seat, if needed. Take short walks on even surfaces as soon as you are able. Try to increase the length of time you walk each day. Do not sit, drive, or stand in  one place for more than 30 minutes at a time. Sitting or standing for long periods of time can put stress on your back. Do not drive or use heavy machinery while taking prescription pain medicine. Use proper lifting techniques. When you bend and lift, use positions that put less stress on your back: Dieterich your knees. Keep the load close to your body. Avoid twisting. Exercise regularly as told by your health care provider. Exercising helps your back heal faster and helps prevent back injuries by keeping muscles strong and flexible. Work with a  physical therapist to make a safe exercise program, as recommended by your health care provider. Do any exercises as told by your physical therapist. Lifestyle Maintain a healthy weight. Extra weight puts stress on your back and makes it difficult to have good posture. Avoid activities or situations that make you feel anxious or stressed. Stress and anxiety increase muscle tension and can make back pain worse. Learn ways to manage anxiety and stress, such as through exercise. General instructions Sleep on a firm mattress in a comfortable position. Try lying on your side with your knees slightly bent. If you lie on your back, put a pillow under your knees. Keep your head and neck in a straight line with your spine (neutral position) when using electronic equipment like smartphones or pads. To do this: Raise your smartphone or pad to look at it instead of bending your head or neck to look down. Put the smartphone or pad at the level of your face while looking at the screen. Follow your treatment plan as told by your health care provider. This may include: Cognitive or behavioral therapy. Acupuncture or massage therapy. Meditation or yoga. Contact a health care provider if: You have pain that is not relieved with rest or medicine. You have increasing pain going down into your legs or buttocks. Your pain does not improve after 2 weeks. You have pain at night. You lose weight without trying. You have a fever or chills. You develop nausea or vomiting. You develop abdominal pain. Get help right away if: You develop new bowel or bladder control problems. You have unusual weakness or numbness in your arms or legs. You feel faint. These symptoms may represent a serious problem that is an emergency. Do not wait to see if the symptoms will go away. Get medical help right away. Call your local emergency services (911 in the [place] Do not drive yourself to the hospital. Summary Acute back pain is  sudden and usually short-lived. Use proper lifting techniques. When you bend and lift, use positions that put less stress on your back. Take over-the-counter and prescription medicines only as told by your health care provider, and apply heat or ice as told. This information is not intended to replace advice given to you by your health care provider. Make sure you discuss any questions you have with your health care provider. Document Revised: 03/17/2020 Document Reviewed: 03/17/2020 Elsevier Patient Education  2022 [place].  How to Take Your Blood Pressure Blood pressure is a measurement of how strongly your blood is pressing against the walls of your arteries. Arteries are blood vessels that carry blood from your heart throughout your body. Your health care provider takes your blood pressure at each office visit. You can also take your own blood pressure at home with a blood pressure monitor. You may need to take your own blood pressure to: Confirm a diagnosis of high blood pressure (hypertension). Monitor your blood pressure  over time. Make sure your blood pressure medicine is working. Supplies needed: Blood pressure monitor. Dining room chair to sit in. Table or desk. Small notebook and pencil or pen. How to prepare To get the most accurate reading, avoid the following for 30 minutes before you check your blood pressure: Drinking caffeine. Drinking alcohol. Eating. Smoking. Exercising. Five minutes before you check your blood pressure: Use the bathroom and urinate so that you have an empty bladder. Sit quietly in a dining room chair. Do not sit in a soft couch or an armchair. Do not talk. How to take your blood pressure To check your blood pressure, follow the instructions in the manual that came with your blood pressure monitor. If you have a digital blood pressure monitor, the instructions may be as follows: Sit up straight in a chair. Place your feet on the floor. Do not  cross your ankles or legs. Rest your left arm at the level of your heart on a table or desk or on the arm of a chair. Pull up your shirt sleeve. Wrap the blood pressure cuff around the upper part of your left arm, 1 inch (2.5 cm) above your elbow. It is best to wrap the cuff around bare skin. Fit the cuff snugly around your arm. You should be able to place only one finger between the cuff and your arm. Position the cord so that it rests in the bend of your elbow. Press the power button. Sit quietly while the cuff inflates and deflates. Read the digital reading on the monitor screen and write the numbers down (record them) in a notebook. Wait 2-3 minutes, then repeat the steps, starting at step 1. What does my blood pressure reading mean? A blood pressure reading consists of a higher number over a lower number. Ideally, your blood pressure should be below 120/80. The first ("top") number is called the systolic pressure. It is a measure of the pressure in your arteries as your heart beats. The second ("bottom") number is called the diastolic pressure. It is a measure of the pressure in your arteries as the heart relaxes. Blood pressure is classified into five stages. The following are the stages for adults who do not have a short-term serious illness or a chronic condition. Systolic pressure and diastolic pressure are measured in a unit called mm Hg (millimeters of mercury).  Normal Systolic pressure: below 696. Diastolic pressure: below 80. Elevated Systolic pressure: 789-381. Diastolic pressure: below 80. Hypertension stage 1 Systolic pressure: 017-510. Diastolic pressure: 25-85. Hypertension stage 2 Systolic pressure: 277 or above. Diastolic pressure: 90 or above. You can have elevated blood pressure or hypertension even if only the systolic or only the diastolic number in your reading is higher than normal. Follow these instructions at home: Medicines Take over-the-counter and  prescription medicines only as told by your health care provider. Tell your health care provider if you are having any side effects from blood pressure medicine. General instructions Check your blood pressure as often as recommended by your health care provider. Check your blood pressure at the same time every day. Take your monitor to the next appointment with your health care provider to make sure that: You are using it correctly. It provides accurate readings. Understand what your goal blood pressure numbers are. Keep all follow-up visits as told by your health care provider. This is important. General tips Your health care provider can suggest a reliable monitor that will meet your needs. There are several types of home  blood pressure monitors. Choose a monitor that has an arm cuff. Do not choose a monitor that measures your blood pressure from your wrist or finger. Choose a cuff that wraps snugly around your upper arm. You should be able to fit only one finger between your arm and the cuff. You can buy a blood pressure monitor at most drugstores or online. Where to find more information American Heart Association: www.heart.org Contact a health care provider if: Your blood pressure is consistently high. Your blood pressure is suddenly low. Get help right away if: Your systolic blood pressure is higher than 180. Your diastolic blood pressure is higher than 120. Summary Blood pressure is a measurement of how strongly your blood is pressing against the walls of your arteries. A blood pressure reading consists of a higher number over a lower number. Ideally, your blood pressure should be below 120/80. Check your blood pressure at the same time every day. Avoid caffeine, alcohol, smoking, and exercise for 30 minutes prior to checking your blood pressure. These agents can affect the accuracy of the blood pressure reading. This information is not intended to replace advice given to you by  your health care provider. Make sure you discuss any questions you have with your health care provider. Document Revised: 11/03/2019 Document Reviewed: 12/18/2018 Elsevier Patient Education  2022 [employer].
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
## 2020-12-05 ENCOUNTER — Telehealth: Payer: Self-pay | Admitting: Internal Medicine

## 2020-12-05 NOTE — Telephone Encounter (Signed)
Patient was seen yesterday. Fax was received today for providers viewing.

## 2020-12-05 NOTE — Telephone Encounter (Signed)
Paient called in about if paperwork was received from [place],

## 2020-12-06 ENCOUNTER — Telehealth: Payer: Self-pay

## 2020-12-06 NOTE — Telephone Encounter (Signed)
Patient was seen on 12/04/20 for a hospital follow up appt. He was given a note to remain out of work until 12/11/20. He was suppose to be seen on 12/11/20 for follow up but there are no available appointments. His next appt is scheduled on 12/14/20. He needs an extended note for work until his appointment. Would like a call back please.

## 2020-12-08 NOTE — Telephone Encounter (Signed)
Patient states he will wait until his apt 12/14/2020.

## 2020-12-14 ENCOUNTER — Ambulatory Visit: Payer: Self-pay | Admitting: Physician Assistant

## 2020-12-15 ENCOUNTER — Other Ambulatory Visit: Payer: Self-pay

## 2020-12-15 ENCOUNTER — Ambulatory Visit: Payer: BC Managed Care – PPO | Attending: Physician Assistant | Admitting: Internal Medicine

## 2020-12-15 VITALS — BP 143/93 | HR 83 | Ht 75.0 in | Wt 160.4 lb

## 2020-12-15 DIAGNOSIS — M545 Low back pain, unspecified: Secondary | ICD-10-CM

## 2020-12-15 DIAGNOSIS — R0789 Other chest pain: Secondary | ICD-10-CM | POA: Diagnosis not present

## 2020-12-15 DIAGNOSIS — I10 Essential (primary) hypertension: Secondary | ICD-10-CM | POA: Diagnosis not present

## 2020-12-15 MED ORDER — METHOCARBAMOL 500 MG PO TABS: 500.0000 mg | ORAL_TABLET | Freq: Two times a day (BID) | ORAL | 0 refills | Status: DC | PRN

## 2020-12-15 MED ORDER — TRAMADOL HCL 50 MG PO TABS: 50.0000 mg | ORAL_TABLET | Freq: Three times a day (TID) | ORAL | 0 refills | Status: DC | PRN
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
## 2020-12-16 DIAGNOSIS — I10 Essential (primary) hypertension: Secondary | ICD-10-CM | POA: Insufficient documentation

## 2020-12-16 DIAGNOSIS — M545 Low back pain, unspecified: Secondary | ICD-10-CM | POA: Insufficient documentation

## 2020-12-18 ENCOUNTER — Other Ambulatory Visit: Payer: Self-pay | Admitting: Internal Medicine

## 2020-12-18 DIAGNOSIS — M545 Low back pain, unspecified: Secondary | ICD-10-CM

## 2020-12-18 DIAGNOSIS — R0789 Other chest pain: Secondary | ICD-10-CM
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
## 2020-12-18 NOTE — Telephone Encounter (Signed)
Medication Refill - Medication:  traMADol (ULTRAM) 50 MG tablet  methocarbamol (ROBAXIN) 500 MG tablet   Has the patient contacted their pharmacy? Yes.   Medication was sent to the wrong pharmacy, pt was requesting it go to [facility] on [address], the one on [address] has already been canceled.  Preferred Pharmacy (with phone number or street name):  [facility], [address] Phone:  [phone]  Fax:  [phone]     Has the patient been seen for an appointment in the last year OR does the patient have an upcoming appointment? Yes.    Agent: Please be advised that RX refills may take up to 3 business days. We ask that you follow-up with your pharmacy.

## 2020-12-19 ENCOUNTER — Telehealth: Payer: Self-pay | Admitting: Internal Medicine

## 2020-12-19 ENCOUNTER — Other Ambulatory Visit: Payer: Self-pay

## 2020-12-19 ENCOUNTER — Ambulatory Visit (HOSPITAL_COMMUNITY)
Admission: RE | Admit: 2020-12-19 | Discharge: 2020-12-19 | Disposition: A | Discharge status: Home / Self Care | Payer: BC Managed Care – PPO | Source: Ambulatory Visit | Attending: Internal Medicine | Admitting: Internal Medicine

## 2020-12-19 ENCOUNTER — Telehealth: Payer: Self-pay

## 2020-12-19 DIAGNOSIS — M545 Low back pain, unspecified: Secondary | ICD-10-CM | POA: Diagnosis present

## 2020-12-19 DIAGNOSIS — R0789 Other chest pain: Secondary | ICD-10-CM

## 2020-12-19 MED ORDER — TRAMADOL HCL 50 MG PO TABS: 50.0000 mg | ORAL_TABLET | Freq: Three times a day (TID) | ORAL | 0 refills | Status: DC | PRN

## 2020-12-19 MED ORDER — METHOCARBAMOL 500 MG PO TABS: 500.0000 mg | ORAL_TABLET | Freq: Two times a day (BID) | ORAL | 0 refills | Status: AC | PRN

## 2020-12-19 NOTE — Telephone Encounter (Signed)
Returned pt call and made him aware that no referral was needed. He will just need to go to the radiology department.  Pt states his meds were suppose to get resent to the [facility] on [facility] Dr. Wynetta Emery could please resend medications

## 2020-12-19 NOTE — Telephone Encounter (Signed)
Patient called in to inform Dr Wynetta Emery that he is still waiting on his Rx to be sent to the Pharmacy at [facility] on [address] have not had medication in a few days please advise Ph# [phone]

## 2020-12-19 NOTE — Telephone Encounter (Signed)
Copied from [place] [phone]. Topic: General - Other >> Dec 18, 2020 10:21 AM Valere Dross wrote: Reason for CRM: Pt called in stating he was sent to get some x-rays done at [facility], but once he got there they told im a referral was needed in order for him to do that, pt thought PCP had already sent one over, but now requesting if she can, please advise.

## 2020-12-19 NOTE — Telephone Encounter (Signed)
This has already been taking care of

## 2020-12-19 NOTE — Telephone Encounter (Signed)
Copied from [place] [id number]. Topic: General - Inquiry >> Dec 19, 2020 11:59 AM Loma Boston wrote: Reason for CRM: PT states he need meds filled (new) at his recent visit. States he does not know the name but they were called in to the wrong drugstore on the 9th. He states that [facility] will not transfer or give name of meds. Wants a resend as what he was saying does not match anything on list.  Correct drugstore below. States 3 meds. PT # [phone] [place], [place]  Phone:  [phone] Fax:  [phone]

## 2020-12-20 ENCOUNTER — Telehealth: Payer: Self-pay | Admitting: Internal Medicine

## 2020-12-20 NOTE — Telephone Encounter (Signed)
Copied from [place] [phone]. Topic: General - Other >> Dec 20, 2020 10:24 AM Leward Quan A wrote: Reason for CRM: Patient called in to inform Dr Wynetta Emery that he need the paperwork for his short term disability faxed back ASAP and that he is waiting on a call about the results of his Xray done yesterday. Can be reached at [phone]

## 2020-12-21 ENCOUNTER — Telehealth: Payer: Self-pay | Admitting: Internal Medicine

## 2020-12-21 NOTE — Telephone Encounter (Signed)
PC placed to pt this a.m to go over results of x-rays.  Pt informed that x-ray of sternum revealed nondisplaced fx of proximal sternum/breast bone. Advised that management is conservative treatment.  Will take several wks to heal.  I recommend that we do a echo also to check for pericardial effusion and told him that I will have my CMA schedule. X-ray of lumbar spine revealed no fx, some degenerative changes and spondylolisthesis at L5-S1.   Pt inquired about whether I completed form for short term disability for him from [employer].  He states that the company faxed it 12/15/20 after he saw me that day.  He tells me that he called yesterday and was told that we received the form.  I informed pt I do my paper work every Wednesday.  I did forms yesterday (which was Wednesday) and I did not have a form for him in there.  I told him I will have my CMA check and get back to him.   I spoke with my CMA.  She told me that he spoke with [employer] yesterday and they would have no way of knowing whether we received a form or not. She did not receive a fax of form for him.

## 2020-12-22 ENCOUNTER — Telehealth: Payer: Self-pay | Admitting: Internal Medicine

## 2020-12-22 NOTE — Telephone Encounter (Signed)
Phone call placed to patient today.  Patient informed that I checked with our front desk and we have not received short-term disability form from the [employer] for him.  Patient verified fax number that he has which is correct.  I told him that he may want to call them again and have them try sending it to [place] again. Patient states that he spoke with his job today and with the [employer] [facility] and was told that even though I kept him out of work for 2 weeks, they will not accept him back at work until after his next appointment with me.  If at that time I decide to release him to return to work then he can return at that time.  His next appointment with me is 01/18/2021. Patient informed that I was also calling to let him know that I do not think we need to order the echo study on his heart as I had indicated yesterday.  I did look at the report of the CT of the chest that he had done when he was seen in the emergency room on 11/27/2020.  It did not reveal any fluid around the heart at that time. Patient inquired about his physical therapy referral.  Patient told that the referral was submitted.  He has not received a call as yet.  I told him that I will have our referral coordinator see whether she can facilitate getting him an appointment with them fairly soon.  Patient stated that if he is able to find a therapist of his own who can get him in sooner can he do that instead?  I told him that he can but he would need to call and let me know who to submit the referral to and I can cancel the referral to our physical therapist.

## 2020-12-22 NOTE — Telephone Encounter (Signed)
Pt called wanting a note for work having him out of work until the next visit.  CB#  [phone]  He would like to pick up when it is ready.

## 2020-12-22 NOTE — Telephone Encounter (Signed)
Will forward to provider  

## 2020-12-25 NOTE — Telephone Encounter (Signed)
Pt is aware.  

## 2020-12-27 NOTE — Telephone Encounter (Signed)
Patient called in about faxig disability to  to [place], not for him to pick up.

## 2020-12-27 NOTE — Telephone Encounter (Signed)
Paperwork takes 7-10 days to fill out. Once provider fills outs paperwork forms will be faxed and pt will be notified

## 2020-12-27 NOTE — Telephone Encounter (Signed)
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

## 2020-12-27 NOTE — Telephone Encounter (Signed)
Returned pt call and lvm making pt aware that all paperwork takes 7-10 business days. Provider has forms and once they are completed and provider gives them back to me I will fax them and will contact pt to make aware that they have been faxed.

## 2020-12-28 ENCOUNTER — Other Ambulatory Visit: Payer: Self-pay

## 2020-12-28 ENCOUNTER — Ambulatory Visit: Payer: BC Managed Care – PPO | Attending: Internal Medicine

## 2020-12-28 DIAGNOSIS — M6281 Muscle weakness (generalized): Secondary | ICD-10-CM | POA: Insufficient documentation

## 2020-12-28 DIAGNOSIS — R0789 Other chest pain: Secondary | ICD-10-CM | POA: Insufficient documentation

## 2020-12-28 DIAGNOSIS — M545 Low back pain, unspecified: Secondary | ICD-10-CM | POA: Insufficient documentation

## 2020-12-28 DIAGNOSIS — M5386 Other specified dorsopathies, lumbar region: Secondary | ICD-10-CM | POA: Insufficient documentation

## 2020-12-28 NOTE — Telephone Encounter (Signed)
Contacted pt and made aware that form has been faxed and originals are located at the front desk for pick up

## 2020-12-28 NOTE — Telephone Encounter (Signed)
Patient called back stating only one paper was received and fax and needs the other one.
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
## 2021-01-03 ENCOUNTER — Ambulatory Visit: Payer: BC Managed Care – PPO | Admitting: Physical Therapy

## 2021-01-03 ENCOUNTER — Ambulatory Visit: Payer: 59 | Admitting: Medical

## 2021-01-03 ENCOUNTER — Other Ambulatory Visit: Payer: Self-pay

## 2021-01-03 ENCOUNTER — Encounter: Payer: Self-pay | Admitting: Physical Therapy

## 2021-01-03 DIAGNOSIS — R0789 Other chest pain: Secondary | ICD-10-CM

## 2021-01-03 DIAGNOSIS — M545 Low back pain, unspecified: Secondary | ICD-10-CM | POA: Diagnosis not present

## 2021-01-03 DIAGNOSIS — M6281 Muscle weakness (generalized): Secondary | ICD-10-CM

## 2021-01-03 DIAGNOSIS — M5386 Other specified dorsopathies, lumbar region: Secondary | ICD-10-CM

## 2021-01-03 NOTE — Therapy (Signed)
OUTPATIENT PHYSICAL THERAPY TREATMENT NOTE   Patient Name: Donald Cherry MRN: [id number] DOB:[birth year], 45 y.o., male Today's Date: 01/03/2021  PCP: Patient, No Pcp Per (Inactive) REFERRING PROVIDER: Ladell Pier, MD   PT End of Session - 01/03/21 1108     Visit Number 2    Number of Visits 17    Date for PT Re-Evaluation 03/02/21    Authorization Type BCBS COMM PPO Reassess FOTO on 6th and 11th visits    PT Start Time 1102    PT Stop Time 1143    PT Time Calculation (min) 41 min             History reviewed. No pertinent past medical history. History reviewed. No pertinent surgical history. Patient Active Problem List   Diagnosis Date Noted   Acute bilateral low back pain without sciatica 12/16/2020   Essential hypertension 12/16/2020   Gastroesophageal reflux disease without esophagitis 12/04/2020   Aortic atherosclerosis ([place]) 12/04/2020   Elevated blood pressure reading in office without diagnosis of hypertension 12/04/2020   History of tobacco abuse 12/04/2020   Pulmonary emphysema ([place]) 12/04/2020   BMI 20.0-20.9, adult 12/04/2020   REFERRING PROVIDER: Ladell Pier, MD   REFERRING DIAG: Chest pain, mid sternal. Acute bilateral low back pain without sciatica   THERAPY DIAG: Chest pain, mid sternal. Acute bilateral low back pain without sciatica      ONSET DATE: 11/26/20  THERAPY DIAG:  Acute bilateral low back pain without sciatica  Chest pain, mid sternal  Muscle weakness (generalized)  Decreased ROM of lumbar spine  PERTINENT HISTORY: PERTINENT HISTORY:  NA per SnapShot and pt report  PRECAUTIONS: PRECAUTIONS: Sternal- Pt reports being told to limit activity, complete as tolerated   WEIGHT BEARING RESTRICTIONS No  SUBJECTIVE: The pain is a little better with the exercises. I have the most trouble bending over, getting up and lifting things.   PAIN:  Are you having pain? Yes VAS scale: 3/10 Pain location: lower back and  chest Pain orientation: Lower,  PAIN TYPE: constant Pain description: sore  Aggravating factors: bending over, getting up and lifting Relieving factors: lay down, heat     OBJECTIVE:    DIAGNOSTIC FINDINGS:  IMPRESSION: 1. Osteopenia and degenerative change without evidence of acute fracture. 2. 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   IMPRESSION: 1. Subtle nondisplaced fracture of the proximal sternum, approximately 3 cm inferior to the sternomanubrial junction on lateral projection.     PATIENT SURVEYS:  FOTO 37% perceived functional abiltiy   SCREENING FOR RED FLAGS: Bowel or bladder incontinence: No   COGNITION:          Overall cognitive status: Within functional limits for tasks assessed                        SENSATION:          Light touch: Appears intact   MUSCLE LENGTH: Hamstrings: Right TBA deg; Left TBA deg   POSTURE:  Forward head, rounded shoulders, decreased thoracic, decreased lordosis   PALPATION: TTP for the lumbar paraspinals   LUMBARAROM/PROM   A/PROM A/PROM  12/28/2020  Flexion Mod limited, hands to knees, provoked LBP  Extension Mod limited, most significantly provoked L LBP and LE pain    Right lateral flexion Mod limited, provoked L LBP and LE pain  Left lateral flexion Mod limited, provoked L LBP and  LE pain  Right rotation Min limited, provoked R LBP and LE pain  Left rotation Min limited, provoked L LBP and LE pain   (Blank rows = not tested)   LE AROM/PROM:   A/PROM Right 12/28/2020 Left 12/28/2020  Hip flexion      Hip extension      Hip abduction      Hip adduction      Hip internal rotation      Hip external rotation      Knee flexion      Knee extension      Ankle dorsiflexion      Ankle plantarflexion      Ankle inversion      Ankle eversion       (Blank rows = not tested)   LE MMT:   MMT Right 12/28/2020 Left 12/28/2020   Hip flexion 4+ 4  Hip extension 4+ 4  Hip abduction 4+ 4  Hip adduction 5 5  Hip internal rotation 5 4+  Hip external rotation 4+ 4  Knee flexion 5 5  Knee extension 5 5  Ankle dorsiflexion 5 5  Ankle plantarflexion 5 5  Ankle inversion      Ankle eversion       (Blank rows = not tested) MMT for both hips was limited due to pain per pt's reports, L more so than R     LUMBAR SPECIAL TESTS:  Slump test: Negative SI Comp-positive/distraction-negative   FUNCTIONAL TESTS:  5 times sit to stand: 01/03/21: 68.42 sec (with UE assist)  6 minute walk test: 01/03/21: 748 feet (pain increased from 3/10 to 5/10)   GAIT: Distance walked: In clinic Assistive device utilized: None Level of assistance: Complete Independence Comments: Pt walks at a decrease pace             TRANSFERS: Sit stand decreased pain with report of pain   TODAY'S TREATMENT 01/03/21:   - 6MWT captured at 748 feet  -Review of HEP: SKTC 3 x 30sec, bridge 10 x 2, PPT 10 x 2   -LTR x 10 with cues for comfortable ROM  - 5 X STS    -seated lumbar flexion with physioball 10 sec x 10  Modalities: HMP to lumbar with feet on tall bolster   12/28/20 EVAL/Treatment - HL Bridging partial x10, 3"          - HL PPT c min hand reach toward kness for abdominal activation x10, 3"          - HL posterior arm presses for posterior trunk strengthening x10, 3"          - HL SKTC, x3, 15"             PATIENT EDUCATION:  Education details: FOTO score and predictions Person educated: Patient Education method: Explanation, Demonstration, Tactile cues, Verbal cues, and Handouts Education comprehension: verbalized understanding, returned demonstration, verbal cues required, and tactile cues required     HOME EXERCISE PROGRAM: Access Code: [email or web address] URL: [email or web address] Date: 12/28/2020 Prepared by: Gar Ponto   Exercises Supine Bridge - 2 x daily - 7 x weekly - 1 sets - 10 reps - 3 hold Supine Posterior  Pelvic Tilt - 2 x daily - 7 x weekly - 1 sets - 10 reps - 3 hold Hooklying Single Knee to Chest Stretch - 2 x daily - 7 x weekly - 1 sets - 3 reps - 15 hold     ASSESSMENT:   CLINICAL IMPRESSION: Pt arrived  reporting 3/10 pain in chest and low back. He reports continued difficulty with bending, lifting and transfers. Captured 6MWT and 5 x STS. Reviewed HEP and progressed with additional lumbar stretches. His pain increased from 3/10 to 5/10 with 6 MWT and increased to 8/10 with STS test. HMP applied to lumbar at end of session to decrease pain.    REHAB POTENTIAL: Good   CLINICAL DECISION MAKING: Stable/uncomplicated   EVALUATION COMPLEXITY: Low     GOALS:     SHORT TERM GOALS:   STG Name Target Date Goal status  1 Pt will be ind in an initial HEP Baseline: started on eval 01/19/21 INITIAL  2 Pt will voice understanding of measures to assist in the reductio of pain Baseline:  01/19/21 INITIAL  3 Complete eval for sternal pain and set goals 01/19/21 INITIAL    LONG TERM GOALS:    LTG Name Target Date Goal status  1 Improve pt's trunk ROM to minimal limitation for improved function Baseline: Moderate limitations 03/02/21 INITIAL  2 Improve bilat hip strength to 4+/5 or greater for improved function Baseline: See flowsheet 03/02/21 INITIAL  3 Pt will report a decrease in LBP and sternal to 3/10 or less c daily and work related activity  Baseline:low back 0-6/10. Sternum 4-8/10 03/02/21 INITIAL  4 Pt will be ind in proper lifting techniques for return to work Baseline: 03/02/21 INITIAL  5 Increase FOTO perceived functional ability score to predicted value of 62% Baseline: 03/02/21 INITIAL  6 Determine physical demands and sets to help determine readiness for return to work. Baseline: 03/02/21 INITIAL  7   Baseline:        PLAN: PT FREQUENCY: 2x/week   PT DURATION: 8 weeks   PLANNED INTERVENTIONS: Therapeutic exercises, Therapeutic activity, Neuro Muscular re-education, Balance  training, Gait training, Patient/Family education, Joint mobilization, Dry Needling, Electrical stimulation, Spinal mobilization, Cryotherapy, Moist heat, Taping, Traction, Ultrasound, Ionotophoresis 76m/ml Dexamethasone, and Manual therapy   PLAN FOR NEXT SESSION: set goals for  5xSTS and 6MWT (see functional tests), review HEP, sternal pain assessment      DDorene Ar12/28/2022, 11:36 AM

## 2021-01-05 ENCOUNTER — Ambulatory Visit: Payer: BC Managed Care – PPO

## 2021-01-05 ENCOUNTER — Other Ambulatory Visit: Payer: Self-pay

## 2021-01-05 DIAGNOSIS — R0789 Other chest pain: Secondary | ICD-10-CM

## 2021-01-05 DIAGNOSIS — M545 Low back pain, unspecified: Secondary | ICD-10-CM

## 2021-01-05 DIAGNOSIS — M6281 Muscle weakness (generalized): Secondary | ICD-10-CM

## 2021-01-05 DIAGNOSIS — M5386 Other specified dorsopathies, lumbar region: Secondary | ICD-10-CM

## 2021-01-05 NOTE — Therapy (Signed)
OUTPATIENT PHYSICAL THERAPY TREATMENT NOTE/Eval for Sternum   Patient Name: Donald Cherry MRN: [id number] DOB:June 23, 1975, 45 y.o., male Today's Date: 01/05/2021  PCP: Patient, No Pcp Per (Inactive) REFERRING PROVIDER: Ladell Pier, MD   PT End of Session - 01/05/21 0954     Visit Number 3    Number of Visits 17    Date for PT Re-Evaluation 03/02/21    Authorization Type BCBS COMM PPO Reassess FOTO on 6th and 11th visits    PT Start Time 0849    PT Stop Time 0936    PT Time Calculation (min) 47 min    Activity Tolerance Patient tolerated treatment well    Behavior During Therapy [facility] for tasks assessed/performed              History reviewed. No pertinent past medical history. History reviewed. No pertinent surgical history. Patient Active Problem List   Diagnosis Date Noted   Acute bilateral low back pain without sciatica 12/16/2020   Essential hypertension 12/16/2020   Gastroesophageal reflux disease without esophagitis 12/04/2020   Aortic atherosclerosis ([place]) 12/04/2020   Elevated blood pressure reading in office without diagnosis of hypertension 12/04/2020   History of tobacco abuse 12/04/2020   Pulmonary emphysema ([place]) 12/04/2020   BMI 20.0-20.9, adult 12/04/2020   REFERRING PROVIDER: Ladell Pier, MD   REFERRING DIAG: Chest pain, mid sternal. Acute bilateral low back pain without sciatica   THERAPY DIAG: Chest pain, mid sternal. Acute bilateral low back pain without sciatica      ONSET DATE: 11/26/20  THERAPY DIAG:  Acute bilateral low back pain without sciatica  Chest pain, mid sternal  Muscle weakness (generalized)  Decreased ROM of lumbar spine  PERTINENT HISTORY: PERTINENT HISTORY:  NA per SnapShot and pt report  PRECAUTIONS: PRECAUTIONS: Sternal- Pt reports being told to limit activity, complete as tolerated   WEIGHT BEARING RESTRICTIONS No  SUBJECTIVE: Pain is improving slowly. Pt feels like he is getting  stronger.  PAIN:  Are you having pain? Yes VAS scale: 4/10 for low back, 2/10 for strenum and chest, L>R Pain location: lower back and chest Pain orientation: Lower,  PAIN TYPE: constant Pain description: sore  Aggravating factors: bending over, getting up and lifting Relieving factors: lay down, heat     OBJECTIVE:    DIAGNOSTIC FINDINGS:  IMPRESSION: 1. Osteopenia and degenerative change without evidence of acute fracture. 2. 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   IMPRESSION: 1. Subtle nondisplaced fracture of the proximal sternum, approximately 3 cm inferior to the sternomanubrial junction on lateral projection.     PATIENT SURVEYS:  FOTO 37% perceived functional abiltiy   SCREENING FOR RED FLAGS: Bowel or bladder incontinence: No   COGNITION:          Overall cognitive status: Within functional limits for tasks assessed                        SENSATION:          Light touch: Appears intact   MUSCLE LENGTH: Hamstrings: Right TBA deg; Left TBA deg   POSTURE:  Forward head, rounded shoulders, decreased thoracic, decreased lordosis   PALPATION: TTP for the lumbar paraspinals   LUMBARAROM/PROM   A/PROM A/PROM  12/28/2020  Flexion Mod limited, hands to knees, provoked LBP  Extension Mod limited, most significantly provoked L LBP and LE pain    Right  lateral flexion Mod limited, provoked L LBP and LE pain  Left lateral flexion Mod limited, provoked L LBP and LE pain  Right rotation Min limited, provoked R LBP and LE pain  Left rotation Min limited, provoked L LBP and LE pain   (Blank rows = not tested)   LE AROM/PROM:   A/PROM Right 12/28/2020 Left 12/28/2020  Hip flexion      Hip extension      Hip abduction      Hip adduction      Hip internal rotation      Hip external rotation      Knee flexion      Knee extension      Ankle dorsiflexion      Ankle  plantarflexion      Ankle inversion      Ankle eversion       (Blank rows = not tested)   LE MMT:   MMT Right 12/28/2020 Left 12/28/2020  Hip flexion 4+ 4  Hip extension 4+ 4  Hip abduction 4+ 4  Hip adduction 5 5  Hip internal rotation 5 4+  Hip external rotation 4+ 4  Knee flexion 5 5  Knee extension 5 5  Ankle dorsiflexion 5 5  Ankle plantarflexion 5 5  Ankle inversion      Ankle eversion       (Blank rows = not tested) MMT for both hips was limited due to pain per pt's reports, L more so than R     LUMBAR SPECIAL TESTS:  Slump test: Negative SI Comp-positive/distraction-negative   FUNCTIONAL TESTS:  5 times sit to stand: 01/03/21: 68.42 sec (with UE assist)  6 minute walk test: 01/03/21: 748 feet (pain increased from 3/10 to 5/10)   GAIT: Distance walked: In clinic Assistive device utilized: None Level of assistance: Complete Independence Comments: Pt walks at a decrease pace             TRANSFERS: Sit stand decreased pain with report of pain   Eval for sternal pain 01/05/21   ROM: Shoulder ROMs were found to be symmetrical and WNLs. ER to T6 bilat. IR to T10 bilat.   Strength: Testing was modified limiting resistance related to pt's report of sternal discomfort. B shoulders were found to be 4/5    [facility] Adult PT Treatment:        DATE: 01/05/21 Therapeutic Exercise: Shoulder ER x10 YTB Shoulder rows x10 YTB Shoulder ext x10 YTB Chest press x10 YTB 90/90 chest stretch x2, 30" gentle Bridging x10 PPT x10, 3 sec Hip clams GTB 10x Hip add sets c ball squeeze, 3 " Seated lumbar flexion forward and laterally with physioball 10 sec x 5, ea  Self Care: HEP for chest/UB and progression c low back exs, see below    TODAY'S TREATMENT 01/03/21:   - 6MWT captured at 748 feet  -Review of HEP: SKTC 3 x 30sec, bridge 10 x 2, PPT 10 x 2   -LTR x 10 with cues for comfortable ROM  - 5 X STS    -seated lumbar flexion with physioball 10 sec x 10  Modalities:  HMP to lumbar with feet on tall bolster   12/28/20 EVAL/Treatment - HL Bridging partial x10, 3"          - HL PPT c min hand reach toward kness for abdominal activation x10, 3"          - HL posterior arm presses for posterior trunk strengthening x10, 3"          -  HL SKTC, x3, 15"             PATIENT EDUCATION:  Education details: FOTO score and predictions Person educated: Patient Education method: Explanation, Demonstration, Tactile cues, Verbal cues, and Handouts Education comprehension: verbalized understanding, returned demonstration, verbal cues required, and tactile cues required     HOME EXERCISE PROGRAM:  Access Code: [email or web address] URL: [email or web address] Date: 01/05/2021 Prepared by: Gar Ponto  Exercises Supine Bridge - 2 x daily - 7 x weekly - 1 sets - 10 reps - 3 hold Supine Posterior Pelvic Tilt - 2 x daily - 7 x weekly - 1 sets - 10 reps - 3 hold Hooklying Single Knee to Chest Stretch - 2 x daily - 7 x weekly - 1 sets - 3 reps - 15 hold Hooklying Clamshell with Resistance - 1 x daily - 7 x weekly - 1 sets - 10 reps - 3 hold Supine Hip Adduction Isometric with Ball - 1 x daily - 7 x weekly - 1 sets - 10 reps - 3 hold Standing Shoulder Row with Anchored Resistance - 1 x daily - 7 x weekly - 1 sets - 10 reps - 3 hold Shoulder Extension with Resistance - 1 x daily - 7 x weekly - 1 sets - 10 reps - 3 hold Shoulder External Rotation and Scapular Retraction with Resistance - 1 x daily - 7 x weekly - 1 sets - 10 reps - 3 hold Standing Serratus Punch with Resistance - 1 x daily - 7 x weekly - 1 sets - 10 reps - 3 hold Doorway Pec Stretch at 90 Degrees Abduction - 1 x daily - 7 x weekly - 1 sets - 3 reps - 20 hold    ASSESSMENT:   CLINICAL IMPRESSION:  Eval was completed for the sternum. Shoulder ROM is WNLs and strength is limited by discomfort. Light UB strengthening and stretching was initiated today. Pt returned demonstration of therex. Additionally,  lumbopelvic flexibility and strengthening/stability therex were completed with additional exs added for strengthening and stability. Pt appears to be making appropriate progress with his sub-acute conditions. Pt tolerated today's session without adverse effects. Pt will benenfit from skilled PT to improve UB function with less pain.     REHAB POTENTIAL: Good   CLINICAL DECISION MAKING: Stable/uncomplicated   EVALUATION COMPLEXITY: Low     GOALS:     SHORT TERM GOALS:   STG Name Target Date Goal status  1 Pt will be ind in an initial HEP Baseline: started on eval 01/19/21 INITIAL  2 Pt will voice understanding of measures to assist in the reductio of pain Baseline:  01/19/21 INITIAL  3 Complete eval for sternal pain and set goals 01/19/21 INITIAL    LONG TERM GOALS:    LTG Name Target Date Goal status  1 Improve pt's trunk ROM to minimal limitation for improved function Baseline: Moderate limitations 03/02/21 INITIAL  2 Improve bilat hip strength to 4+/5 or greater for improved function Baseline: See flowsheet 03/02/21 INITIAL  3 Pt will report a decrease in LBP and sternal to 3/10 or less c daily and work related activity  Baseline:low back 0-6/10. Sternum 4-8/10 03/02/21 INITIAL  4 Pt will be ind in proper lifting techniques for return to work Baseline: 03/02/21 INITIAL  5 Increase FOTO perceived functional ability score to predicted value of 62% Baseline: 03/02/21 INITIAL  6 Determine physical demands and sets to help determine readiness for return to work. Baseline: 03/02/21 INITIAL  7  Pt will  demostrate 5/5 strength for B UEs Baseline: 03/02/21  INITIAL     PLAN: PT FREQUENCY: 2x/week   PT DURATION: 8 weeks   PLANNED INTERVENTIONS: Therapeutic exercises, Therapeutic activity, Neuro Muscular re-education, Balance training, Gait training, Patient/Family education, Joint mobilization, Dry Needling, Electrical stimulation, Spinal mobilization, Cryotherapy, Moist heat, Taping,  Traction, Ultrasound, Ionotophoresis 16m/ml Dexamethasone, and Manual therapy   PLAN FOR NEXT SESSION: set goals for  5xSTS and 6MWT (see functional tests), review HEP, sternal pain assessment      Bronco Mcgrory MS, PT 01/05/21 10:05 AM

## 2021-01-07 DIAGNOSIS — I10 Essential (primary) hypertension: Secondary | ICD-10-CM

## 2021-01-07 DIAGNOSIS — K219 Gastro-esophageal reflux disease without esophagitis: Secondary | ICD-10-CM

## 2021-01-07 HISTORY — DX: Gastro-esophageal reflux disease without esophagitis: K21.9

## 2021-01-07 HISTORY — DX: Essential (primary) hypertension: I10

## 2021-01-09 ENCOUNTER — Other Ambulatory Visit: Payer: Self-pay

## 2021-01-09 ENCOUNTER — Ambulatory Visit: Payer: BC Managed Care – PPO | Attending: Internal Medicine

## 2021-01-09 DIAGNOSIS — M5386 Other specified dorsopathies, lumbar region: Secondary | ICD-10-CM | POA: Diagnosis present

## 2021-01-09 DIAGNOSIS — M6281 Muscle weakness (generalized): Secondary | ICD-10-CM | POA: Insufficient documentation

## 2021-01-09 DIAGNOSIS — R0789 Other chest pain: Secondary | ICD-10-CM | POA: Diagnosis present

## 2021-01-09 DIAGNOSIS — M545 Low back pain, unspecified: Secondary | ICD-10-CM | POA: Diagnosis present

## 2021-01-09 NOTE — Therapy (Signed)
OUTPATIENT PHYSICAL THERAPY TREATMENT NOTE   Patient Name: Donald Cherry MRN: [id number] DOB:04-20-1975, 46 y.o., male Today's Date: 01/09/2021  PCP: Patient, No Pcp Per (Inactive) REFERRING PROVIDER: Ladell Pier, MD   PT End of Session - 01/09/21 1422     Visit Number 4    Number of Visits 17    Date for PT Re-Evaluation 03/02/21    Authorization Type BCBS COMM PPO Reassess FOTO on 6th and 11th visits    PT Start Time 1332    PT Stop Time 1420    PT Time Calculation (min) 48 min    Activity Tolerance Patient tolerated treatment well    Behavior During Therapy [facility] for tasks assessed/performed               History reviewed. No pertinent past medical history. History reviewed. No pertinent surgical history. Patient Active Problem List   Diagnosis Date Noted   Acute bilateral low back pain without sciatica 12/16/2020   Essential hypertension 12/16/2020   Gastroesophageal reflux disease without esophagitis 12/04/2020   Aortic atherosclerosis ([place]) 12/04/2020   Elevated blood pressure reading in office without diagnosis of hypertension 12/04/2020   History of tobacco abuse 12/04/2020   Pulmonary emphysema ([place]) 12/04/2020   BMI 20.0-20.9, adult 12/04/2020   REFERRING PROVIDER: Ladell Pier, MD   REFERRING DIAG: Chest pain, mid sternal. Acute bilateral low back pain without sciatic    ONSET DATE: 11/26/20  THERAPY DIAG:  Acute bilateral low back pain without sciatica  Chest pain, mid sternal  Muscle weakness (generalized)  Decreased ROM of lumbar spine  PERTINENT HISTORY: PERTINENT HISTORY:  NA per SnapShot and pt report  PRECAUTIONS: PRECAUTIONS: Sternal- Pt reports being told to limit activity, complete as tolerated   WEIGHT BEARING RESTRICTIONS No  SUBJECTIVE: Both back and chest are getting a little better. I tolerated the chest exs OK. At work I'm on my feet all day, lifting every 5 mins from various heights, floor to shoulder height,  20lbs unassisted, 70 lbs assisted by machine.  PAIN:  Are you having pain? Yes VAS scale: 5/10 for low back, 5/10 for strenum and chest, L>R Pain location: lower back and chest Pain orientation: Lower back, sternum PAIN TYPE: constant Pain description: sore  Aggravating factors: bending over, getting up and lifting Relieving factors: lay down, heat     OBJECTIVE:    DIAGNOSTIC FINDINGS:  IMPRESSION: 1. Osteopenia and degenerative change without evidence of acute fracture. 2. 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   IMPRESSION: 1. Subtle nondisplaced fracture of the proximal sternum, approximately 3 cm inferior to the sternomanubrial junction on lateral projection.     PATIENT SURVEYS:  FOTO 37% perceived functional abiltiy   SCREENING FOR RED FLAGS: Bowel or bladder incontinence: No   COGNITION:          Overall cognitive status: Within functional limits for tasks assessed                        SENSATION:          Light touch: Appears intact   MUSCLE LENGTH: Hamstrings: Right TBA deg; Left TBA deg   POSTURE:  Forward head, rounded shoulders, decreased thoracic, decreased lordosis   PALPATION: TTP for the lumbar paraspinals   LUMBARAROM/PROM   A/PROM A/PROM  12/28/2020  Flexion Mod limited, hands to knees, provoked LBP  Extension Mod  limited, most significantly provoked L LBP and LE pain    Right lateral flexion Mod limited, provoked L LBP and LE pain  Left lateral flexion Mod limited, provoked L LBP and LE pain  Right rotation Min limited, provoked R LBP and LE pain  Left rotation Min limited, provoked L LBP and LE pain   (Blank rows = not tested)   LE AROM/PROM:   A/PROM Right 12/28/2020 Left 12/28/2020  Hip flexion      Hip extension      Hip abduction      Hip adduction      Hip internal rotation      Hip external rotation      Knee flexion      Knee  extension      Ankle dorsiflexion      Ankle plantarflexion      Ankle inversion      Ankle eversion       (Blank rows = not tested)   LE MMT:   MMT Right 12/28/2020 Left 12/28/2020  Hip flexion 4+ 4  Hip extension 4+ 4  Hip abduction 4+ 4  Hip adduction 5 5  Hip internal rotation 5 4+  Hip external rotation 4+ 4  Knee flexion 5 5  Knee extension 5 5  Ankle dorsiflexion 5 5  Ankle plantarflexion 5 5  Ankle inversion      Ankle eversion       (Blank rows = not tested) MMT for both hips was limited due to pain per pt's reports, L more so than R     LUMBAR SPECIAL TESTS:  Slump test: Negative SI Comp-positive/distraction-negative   FUNCTIONAL TESTS:  5 times sit to stand: 01/03/21: 68.42 sec (with UE assist)  6 minute walk test: 01/03/21: 748 feet (pain increased from 3/10 to 5/10)   GAIT: Distance walked: In clinic Assistive device utilized: None Level of assistance: Complete Independence Comments: Pt walks at a decrease pace             TRANSFERS: Sit stand decreased pain with report of pain   Eval for sternal pain 01/05/21   ROM: Shoulder ROMs were found to be symmetrical and WNLs. ER to T6 bilat. IR to T10 bilat.   Strength: Testing was modified limiting resistance related to pt's report of sternal discomfort. B shoulders were found to be 4/5   [facility] Adult PT Treatment:        DATE: 01/09/2021  Therapeutic Exercise:  Nu- Step, 5 mins, L4, UE/LE  Seated lumbar flexion forward and laterally with physioball 10 sec x 5 LTR x 10 with cues for comfortable ROM SKTC 3 x 30sec Mod Thomas stretch,  1x30" Bridging x15 , 5" PPT x15, 3 sec Prone hip ext, 2x10  Manual Therapy: With palpation to the lumbar paraspinals pt was tender L with muscle spasms    [facility] Adult PT Treatment:        DATE: 01/05/21 Therapeutic Exercise: Shoulder ER x10 YTB Shoulder rows x10 YTB Shoulder ext x10 YTB Chest press x10 YTB 90/90 chest stretch x2, 30" gentle Bridging x10 PPT  x10, 3 sec Hip clams GTB 10x Hip add sets c ball squeeze, 3 " Seated lumbar flexion forward and laterally with physioball 10 sec x 5, ea  Self Care: HEP for chest/UB and progression c low back exs, see below    TODAY'S TREATMENT 01/03/21:   - 6MWT captured at 748 feet  -Review of HEP: SKTC 3 x 30sec, bridge 10 x  2, PPT 10 x 2   -LTR x 10 with cues for comfortable ROM  - 5 X STS    -seated lumbar flexion with physioball 10 sec x 10  Modalities: HMP to lumbar with feet on tall bolster   12/28/20 EVAL/Treatment - HL Bridging partial x10, 3"          - HL PPT c min hand reach toward kness for abdominal activation x10, 3"          - HL posterior arm presses for posterior trunk strengthening x10, 3"          - HL SKTC, x3, 15"             PATIENT EDUCATION:  Education details: FOTO score and predictions Person educated: Patient Education method: Explanation, Demonstration, Tactile cues, Verbal cues, and Handouts Education comprehension: verbalized understanding, returned demonstration, verbal cues required, and tactile cues required     HOME EXERCISE PROGRAM:  Access Code: [email or web address] URL: [email or web address] Date: 01/05/2021 Prepared by: Gar Ponto  Exercises Supine Bridge - 2 x daily - 7 x weekly - 1 sets - 10 reps - 3 hold Supine Posterior Pelvic Tilt - 2 x daily - 7 x weekly - 1 sets - 10 reps - 3 hold Hooklying Single Knee to Chest Stretch - 2 x daily - 7 x weekly - 1 sets - 3 reps - 15 hold Hooklying Clamshell with Resistance - 1 x daily - 7 x weekly - 1 sets - 10 reps - 3 hold Supine Hip Adduction Isometric with Ball - 1 x daily - 7 x weekly - 1 sets - 10 reps - 3 hold Standing Shoulder Row with Anchored Resistance - 1 x daily - 7 x weekly - 1 sets - 10 reps - 3 hold Shoulder Extension with Resistance - 1 x daily - 7 x weekly - 1 sets - 10 reps - 3 hold Shoulder External Rotation and Scapular Retraction with Resistance - 1 x daily - 7 x weekly - 1 sets - 10  reps - 3 hold Standing Serratus Punch with Resistance - 1 x daily - 7 x weekly - 1 sets - 10 reps - 3 hold Doorway Pec Stretch at 90 Degrees Abduction - 1 x daily - 7 x weekly - 1 sets - 3 reps - 20 hold    ASSESSMENT:   CLINICAL IMPRESSION: PT was focused on lumbopelvic flexibility and strengthening/stability. With palpation, the pt is tender on the L paraspinals with muscle spasms provoked. Discussed TPDN with benefits and precautions. Will provided symptom management for the L low back the next PT session. Overall, pt reports low back and sternal pain improvement.    REHAB POTENTIAL: Good   CLINICAL DECISION MAKING: Stable/uncomplicated   EVALUATION COMPLEXITY: Low     GOALS:     SHORT TERM GOALS:   STG Name Target Date Goal status  1 Pt will be ind in an initial HEP Baseline: started on eval 01/19/21 INITIAL  2 Pt will voice understanding of measures to assist in the reductio of pain Baseline:  01/19/21 INITIAL  3 Complete eval for sternal pain and set goals 01/19/21 INITIAL    LONG TERM GOALS:    LTG Name Target Date Goal status  1 Improve pt's trunk ROM to minimal limitation for improved function Baseline: Moderate limitations 03/02/21 INITIAL  2 Improve bilat hip strength to 4+/5 or greater for improved function Baseline: See flowsheet 03/02/21 INITIAL  3 Pt will report a decrease in  LBP and sternal to 3/10 or less c daily and work related activity  Baseline:low back 0-6/10. Sternum 4-8/10 03/02/21 INITIAL  4 Pt will be ind in proper lifting techniques for return to work Baseline: 03/02/21 INITIAL  5 Increase FOTO perceived functional ability score to predicted value of 62% Baseline: 03/02/21 INITIAL  6 Determine physical demands and sets to help determine readiness for return to work. 01/09/21: Will be demonstrate proper technique for floor to waist lifting of 40# for 2x10 reps and of 20# from waist to shoulder height 2x10 to demonstrate functional abilities for return to  work. Baseline: 03/02/21 INITIAL  7  Pt will demostrate 5/5 strength for B UEs Baseline: 03/02/21  INITIAL     PLAN: PT FREQUENCY: 2x/week   PT DURATION: 8 weeks   PLANNED INTERVENTIONS: Therapeutic exercises, Therapeutic activity, Neuro Muscular re-education, Balance training, Gait training, Patient/Family education, Joint mobilization, Dry Needling, Electrical stimulation, Spinal mobilization, Cryotherapy, Moist heat, Taping, Traction, Ultrasound, Ionotophoresis 60m/ml Dexamethasone, and Manual therapy   PLAN FOR NEXT SESSION: set goals for  5xSTS and 6MWT (see functional tests), review HEP, sternal pain assessment     AGar PontoMS, PT 01/09/21 6:15 PM

## 2021-01-11 ENCOUNTER — Other Ambulatory Visit: Payer: Self-pay

## 2021-01-11 ENCOUNTER — Ambulatory Visit: Payer: BC Managed Care – PPO

## 2021-01-11 DIAGNOSIS — R0789 Other chest pain: Secondary | ICD-10-CM

## 2021-01-11 DIAGNOSIS — M545 Low back pain, unspecified: Secondary | ICD-10-CM

## 2021-01-11 DIAGNOSIS — M6281 Muscle weakness (generalized): Secondary | ICD-10-CM

## 2021-01-11 NOTE — Therapy (Addendum)
OUTPATIENT PHYSICAL THERAPY TREATMENT NOTE     Patient Name: Donald Cherry MRN: [id number] DOB:[birth year], 46 y.o., male Today's Date: 01/11/2021   PCP: Patient, No Pcp Per (Inactive) REFERRING PROVIDER: Ladell Pier, MD     PT End of Session - 01/11/21 1152       Visit Number 5     Number of Visits 17     Date for PT Re-Evaluation 03/02/21     Authorization Type BCBS COMM PPO Reassess FOTO on 6th and 11th visits     PT Start Time 1149     PT Stop Time 1234     PT Time Calculation (min) 45 min     Activity Tolerance Patient tolerated treatment well     Behavior During Therapy [facility] for tasks assessed/performed;Restless                       History reviewed. No pertinent past medical history. History reviewed. No pertinent surgical history.     Patient Active Problem List    Diagnosis Date Noted   Acute bilateral low back pain without sciatica 12/16/2020   Essential hypertension 12/16/2020   Gastroesophageal reflux disease without esophagitis 12/04/2020   Aortic atherosclerosis ([place]) 12/04/2020   Elevated blood pressure reading in office without diagnosis of hypertension 12/04/2020   History of tobacco abuse 12/04/2020   Pulmonary emphysema ([place]) 12/04/2020   BMI 20.0-20.9, adult 12/04/2020    REFERRING PROVIDER: Ladell Pier, MD    REFERRING DIAG: Chest pain, mid sternal. Acute bilateral low back pain without sciatic    ONSET DATE: 11/26/20   THERAPY DIAG:  Acute bilateral low back pain without sciatica   Chest pain, mid sternal   Muscle weakness (generalized)   PERTINENT HISTORY: PERTINENT HISTORY:  NA per SnapShot and pt report   PRECAUTIONS: PRECAUTIONS: Sternal- Pt reports being told to limit activity, complete as tolerated   WEIGHT BEARING RESTRICTIONS No   SUBJECTIVE: I over did it my exercises last night and my chest and low back are more sore.   PAIN:  Are you having pain? Yes VAS scale: 5/10 for low back L>R, 5/10 for strenum and  chest Pain location: lower back and chest Pain orientation: Lower back, sternum PAIN TYPE: constant Pain description: sore  Aggravating factors: bending over, getting up and lifting Relieving factors: lay down, heat        OBJECTIVE:    DIAGNOSTIC FINDINGS:  IMPRESSION: 1. Osteopenia and degenerative change without evidence of acute fracture. 2. 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   IMPRESSION: 1. Subtle nondisplaced fracture of the proximal sternum, approximately 3 cm inferior to the sternomanubrial junction on lateral projection.     PATIENT SURVEYS:  FOTO 37% perceived functional abiltiy   SCREENING FOR RED FLAGS: Bowel or bladder incontinence: No   COGNITION:          Overall cognitive status: Within functional limits for tasks assessed                        SENSATION:          Light touch: Appears intact   MUSCLE LENGTH: Hamstrings: Right TBA deg; Left TBA deg   POSTURE:  Forward head, rounded shoulders, decreased thoracic, decreased lordosis   PALPATION: TTP for the lumbar paraspinals   LUMBARAROM/PROM   A/PROM A/PROM  12/28/2020  Flexion  Mod limited, hands to knees, provoked LBP  Extension Mod limited, most significantly provoked L LBP and LE pain    Right lateral flexion Mod limited, provoked L LBP and LE pain  Left lateral flexion Mod limited, provoked L LBP and LE pain  Right rotation Min limited, provoked R LBP and LE pain  Left rotation Min limited, provoked L LBP and LE pain   (Blank rows = not tested)   LE AROM/PROM:   A/PROM Right 12/28/2020 Left 12/28/2020  Hip flexion      Hip extension      Hip abduction      Hip adduction      Hip internal rotation      Hip external rotation      Knee flexion      Knee extension      Ankle dorsiflexion      Ankle plantarflexion      Ankle inversion      Ankle eversion       (Blank rows = not tested)    LE MMT:   MMT Right 12/28/2020 Left 12/28/2020  Hip flexion 4+ 4  Hip extension 4+ 4  Hip abduction 4+ 4  Hip adduction 5 5  Hip internal rotation 5 4+  Hip external rotation 4+ 4  Knee flexion 5 5  Knee extension 5 5  Ankle dorsiflexion 5 5  Ankle plantarflexion 5 5  Ankle inversion      Ankle eversion       (Blank rows = not tested) MMT for both hips was limited due to pain per pt's reports, L more so than R     LUMBAR SPECIAL TESTS:  Slump test: Negative SI Comp-positive/distraction-negative   FUNCTIONAL TESTS:  5 times sit to stand: 01/03/21: 68.42 sec (with UE assist)  6 minute walk test: 01/03/21: 748 feet (pain increased from 3/10 to 5/10)   GAIT: Distance walked: In clinic Assistive device utilized: None Level of assistance: Complete Independence Comments: Pt walks at a decrease pace             TRANSFERS: Sit stand decreased pain with report of pain               Eval for sternal pain 01/05/21               ROM: Shoulder ROMs were found to be symmetrical and WNLs. ER to T6 bilat. IR to T10 bilat.               Strength: Testing was modified limiting resistance related to pt's report of sternal discomfort. B shoulders were found to be 4/5   [facility] Adult PT Treatment:                                                DATE: 01/11/21 Therapeutic Exercise:   Standing hip ext in min trunk flexion, alt, x15 Shoulder row, RTB, 2x15 Shoulder ext, RTB, 2x15 STS, 25" height, x10, labored Shoulder flex to 90d, 2x10, 2# Chest/serratus press, 2x10, YTB Seated lumbar flexion forward and laterally with physioball 10 sec x 5   Modalities: Estim: IFC to the bilat low back for pain relief ,intensity 18, strong but comfortable for pt.,15 mins, with moist heat   Not completed this PT session: LTR x 10 with cues for comfortable ROM SKTC 3 x 30sec Mod Thomas  stretch,  1x30" Bridging x15 , 5" PPT x15, 3 sec Prone hip ext, 2x10                 [facility] Adult PT Treatment:         DATE: 01/09/2021   Therapeutic Exercise:   Nu- Step, 5 mins, L4, UE/LE   Seated lumbar flexion forward and laterally with physioball 10 sec x 5 LTR x 10 with cues for comfortable ROM SKTC 3 x 30sec Mod Thomas stretch,  1x30" Bridging x15 , 5" PPT x15, 3 sec Prone hip ext, 2x10   Manual Therapy: With palpation to the lumbar paraspinals pt was tender L with muscle spasms      [facility] Adult PT Treatment:        DATE: 01/05/21 Therapeutic Exercise: Shoulder ER x10 YTB Shoulder rows x10 YTB Shoulder ext x10 YTB Chest press x10 YTB 90/90 chest stretch x2, 30" gentle Bridging x10 PPT x10, 3 sec Hip clams GTB 10x Hip add sets c ball squeeze, 3 " Seated lumbar flexion forward and laterally with physioball 10 sec x 5, ea   Self Care: HEP for chest/UB and progression c low back exs, see below     TODAY'S TREATMENT 01/03/21:              - 6MWT captured at 748 feet             -Review of HEP: SKTC 3 x 30sec, bridge 10 x 2, PPT 10 x 2              -LTR x 10 with cues for comfortable ROM             - 5 X STS               -seated lumbar flexion with physioball 10 sec x 10             Modalities: HMP to lumbar with feet on tall bolster   12/28/20 EVAL/Treatment - HL Bridging partial x10, 3"          - HL PPT c min hand reach toward kness for abdominal activation x10, 3"          - HL posterior arm presses for posterior trunk strengthening x10, 3"          - HL SKTC, x3, 15"             PATIENT EDUCATION:  Education details: FOTO score and predictions Person educated: Patient Education method: Explanation, Demonstration, Tactile cues, Verbal cues, and Handouts Education comprehension: verbalized understanding, returned demonstration, verbal cues required, and tactile cues required     HOME EXERCISE PROGRAM:             Access Code: [email or web address] URL: [email or web address] Date: 01/05/2021 Prepared by: Gar Ponto   Exercises Supine Bridge - 2 x daily - 7 x  weekly - 1 sets - 10 reps - 3 hold Supine Posterior Pelvic Tilt - 2 x daily - 7 x weekly - 1 sets - 10 reps - 3 hold Hooklying Single Knee to Chest Stretch - 2 x daily - 7 x weekly - 1 sets - 3 reps - 15 hold Hooklying Clamshell with Resistance - 1 x daily - 7 x weekly - 1 sets - 10 reps - 3 hold Supine Hip Adduction Isometric with Ball - 1 x daily - 7 x weekly - 1 sets - 10 reps - 3 hold Standing Shoulder Row with Anchored Resistance -  1 x daily - 7 x weekly - 1 sets - 10 reps - 3 hold Shoulder Extension with Resistance - 1 x daily - 7 x weekly - 1 sets - 10 reps - 3 hold Shoulder External Rotation and Scapular Retraction with Resistance - 1 x daily - 7 x weekly - 1 sets - 10 reps - 3 hold Standing Serratus Punch with Resistance - 1 x daily - 7 x weekly - 1 sets - 10 reps - 3 hold Doorway Pec Stretch at 90 Degrees Abduction - 1 x daily - 7 x weekly - 1 sets - 3 reps - 20 hold     ASSESSMENT:   CLINICAL IMPRESSION: PT was completed for trunk and UB strengthening. Gradual progress in the demand of therex was made with pt tolerating without adverse effect. IFC Estim and moist heat was provided to the low back afterwhich the pt reported a decrease in his low back pain from 5/10 at the beginning of the session to 3/10 at end of session. Pt will continue to benefit from skilled PT to optimize function with decreased pain. Discussed use of TPDN which pt declined at this time.     REHAB POTENTIAL: Good   CLINICAL DECISION MAKING: Stable/uncomplicated   EVALUATION COMPLEXITY: Low     GOALS:     SHORT TERM GOALS:   STG Name Target Date Goal status  1 Pt will be ind in an initial HEP Baseline: started on eval 01/19/21 INITIAL  2 Pt will voice understanding of measures to assist in the reductio of pain Baseline:  01/19/21 INITIAL  3 Complete eval for sternal pain and set goals 01/19/21 INITIAL    LONG TERM GOALS:    LTG Name Target Date Goal status  1 Improve pt's trunk ROM to minimal  limitation for improved function Baseline: Moderate limitations 03/02/21 INITIAL  2 Improve bilat hip strength to 4+/5 or greater for improved function Baseline: See flowsheet 03/02/21 INITIAL  3 Pt will report a decrease in LBP and sternal to 3/10 or less c daily and work related activity  Baseline:low back 0-6/10. Sternum 4-8/10 03/02/21 INITIAL  4 Pt will be ind in proper lifting techniques for return to work Baseline: 03/02/21 INITIAL  5 Increase FOTO perceived functional ability score to predicted value of 62% Baseline: 03/02/21 INITIAL  6 Determine physical demands and sets to help determine readiness for return to work. 01/09/21: Will be demonstrate proper technique for floor to waist lifting of 40# for 2x10 reps and of 20# from waist to shoulder height 2x10 to demonstrate functional abilities for return to work. Baseline: 03/02/21 INITIAL  7  Pt will demostrate 5/5 strength for B UEs Baseline: 03/02/21  INITIAL     PLAN: PT FREQUENCY: 2x/week   PT DURATION: 8 weeks   PLANNED INTERVENTIONS: Therapeutic exercises, Therapeutic activity, Neuro Muscular re-education, Balance training, Gait training, Patient/Family education, Joint mobilization, Dry Needling, Electrical stimulation, Spinal mobilization, Cryotherapy, Moist heat, Taping, Traction, Ultrasound, Ionotophoresis 36m/ml Dexamethasone, and Manual therapy   PLAN FOR NEXT SESSION: set goals for  5xSTS and 6MWT (see functional tests), review HEP, sternal pain assessment       AGar PontoMS, PT 01/11/21 2:23 PM

## 2021-01-15 ENCOUNTER — Ambulatory Visit: Payer: BC Managed Care – PPO | Attending: Internal Medicine | Admitting: Internal Medicine

## 2021-01-15 ENCOUNTER — Other Ambulatory Visit: Payer: Self-pay

## 2021-01-15 ENCOUNTER — Encounter: Payer: Self-pay | Admitting: Internal Medicine

## 2021-01-15 VITALS — BP 171/106 | HR 75 | Resp 16 | Wt 163.8 lb

## 2021-01-15 DIAGNOSIS — Z5320 Procedure and treatment not carried out because of patient's decision for unspecified reasons: Secondary | ICD-10-CM

## 2021-01-15 DIAGNOSIS — Z1159 Encounter for screening for other viral diseases: Secondary | ICD-10-CM

## 2021-01-15 DIAGNOSIS — Z8042 Family history of malignant neoplasm of prostate: Secondary | ICD-10-CM

## 2021-01-15 DIAGNOSIS — S2220XA Unspecified fracture of sternum, initial encounter for closed fracture: Secondary | ICD-10-CM

## 2021-01-15 DIAGNOSIS — I10 Essential (primary) hypertension: Secondary | ICD-10-CM

## 2021-01-15 DIAGNOSIS — Z1211 Encounter for screening for malignant neoplasm of colon: Secondary | ICD-10-CM

## 2021-01-15 DIAGNOSIS — R7989 Other specified abnormal findings of blood chemistry: Secondary | ICD-10-CM | POA: Diagnosis not present

## 2021-01-15 DIAGNOSIS — M545 Low back pain, unspecified: Secondary | ICD-10-CM | POA: Diagnosis not present

## 2021-01-15 DIAGNOSIS — R739 Hyperglycemia, unspecified: Secondary | ICD-10-CM

## 2021-01-15 MED ORDER — AMLODIPINE BESYLATE 5 MG PO TABS: 5.0000 mg | ORAL_TABLET | Freq: Every day | ORAL | 4 refills | Status: DC

## 2021-01-15 NOTE — Patient Instructions (Signed)
Your blood pressure is elevated.  The goal is 130/80 or lower.  We have started you on a medication called amlodipine 5 mg daily.  Please check your blood pressure at least twice a week and record your readings.  Bring them with you on your next visit.

## 2021-01-15 NOTE — Progress Notes (Signed)
Patient ID: Donald Cherry, male    DOB: 17-Jul-1975  MRN: [id number]  CC: Back Pain   Subjective: Donald Cherry is a 46 y.o. male who presents for f/u chest due to sternal fracture and back pain His concerns today include:  Former Tob dep, elevated blood pressure, COPD, GERD  Going to P.T 2 x a wk.  Helping a little; feels he is slowly progressing/getting better with therapy.   Still having spasm in lower back.  Using heat therapy, stretch bands, shock therapy during P.T sessions.  Also does stretching exercises at home in a.m and p.m.  Reports taking 1/2 Robaxin 2x/day after his stretches. Still has some Tramadol which helps but takes it only sparingly. Does not like taking pills. He still gets feeling of soreness in his chest when he leans forward or bends over to pick up anything.  The physical therapist has been working with him on this also. Projected P.T sessions until 03/01/2020 He has form with him for short-term disability to be filled out if I plan to extend his time out of work which he feels he needs.  Elev BP: Blood pressure was noted on last visit.  DASH diet encouraged.  Not limiting salt in foods as much as he should.  Has a home BP device at home but needs new battery.  Reports BP usually high in MD office.  Usually at home BP around 140s/90s Quit smoking over 1 yr ago Uses Advil or Aleve occasionally but has not been taking every day.  Would like to be screen for prostate All of his paternal uncles, 5 in all, had prostate CA and his dad also has it  Elev LFT: Noted on blood test done back in November when he saw the PA.  Reports that he was drinking 1-2 beers every night during the week and would have a sixpack on the weekends.  Since New Yr he quit during the wk and only drinks on the weekends.  He was also noted to have elevated blood sugar on blood test done back in November.  No family history of diabetes. Patient Active Problem List   Diagnosis Date Noted   Acute  bilateral low back pain without sciatica 12/16/2020   Essential hypertension 12/16/2020   Gastroesophageal reflux disease without esophagitis 12/04/2020   Aortic atherosclerosis ([place]) 12/04/2020   Elevated blood pressure reading in office without diagnosis of hypertension 12/04/2020   History of tobacco abuse 12/04/2020   Pulmonary emphysema ([place]) 12/04/2020   BMI 20.0-20.9, adult 12/04/2020     Current Outpatient Medications on File Prior to Visit  Medication Sig Dispense Refill   calcium carbonate (TUMS - DOSED IN MG ELEMENTAL CALCIUM) 500 MG chewable tablet Chew 2 tablets by mouth daily as needed for indigestion.     methocarbamol (ROBAXIN) 500 MG tablet Take 1 tablet (500 mg total) by mouth 2 (two) times daily as needed for muscle spasms. 30 tablet 0   Multiple Vitamin (MULTIVITAMIN) capsule Take 1 capsule by mouth daily.     omeprazole (PRILOSEC) 20 MG capsule Take 1 capsule (20 mg total) by mouth daily. 30 capsule 3   Pseudoephedrine HCl (SINUS & ALLERGY 12 HOUR PO) Take 1 tablet by mouth daily as needed (congestion).     traMADol (ULTRAM) 50 MG tablet Take 1 tablet (50 mg total) by mouth every 8 (eight) hours as needed. 24 tablet 0   No current facility-administered medications on file prior to visit.    Allergies  Allergen Reactions   Milk-Related Compounds Diarrhea    Lactose intolerance    Social History   Socioeconomic History   Marital status: Single    Spouse name: Not on file   Number of children: Not on file   Years of education: Not on file   Highest education level: Not on file  Occupational History   Not on file  Tobacco Use   Smoking status: Some Days    Types: Cigarettes   Smokeless tobacco: Never  Vaping Use   Vaping Use: Never used  Substance and Sexual Activity   Alcohol use: Yes   Drug use: No   Sexual activity: Not Currently  Other Topics Concern   Not on file  Social History Narrative   Not on file   Social Determinants of Health    Financial Resource Strain: Not on file  Food Insecurity: Not on file  Transportation Needs: Not on file  Physical Activity: Not on file  Stress: Not on file  Social Connections: Not on file  Intimate Partner Violence: Not on file    Family History  Problem Relation Age of Onset   Heart failure Mother     No past surgical history on file.  ROS: Review of Systems Negative except as stated above  PHYSICAL EXAM: BP (!) 171/106    Pulse 75    Resp 16    Wt 163 lb 12.8 oz (74.3 kg)    SpO2 99%    BMI 20.47 kg/m   Physical Exam   General appearance - alert, well appearing, and in no distress Mental status - normal mood, behavior, speech, dress, motor activity, and thought processes Chest - clear to auscultation, no wheezes, rales or rhonchi, symmetric air entry Heart - normal rate, regular rhythm, normal S1, S2, no murmurs, rubs, clicks or gallops Musculoskeletal -no tenderness on palpation over the breastbone.  No tenderness on palpation of the lumbar spine or surrounding paraspinal muscles.  Gait is normal. Extremities -no lower extremity edema.  CMP Latest Ref Rng & Units 11/27/2020 05/16/2016 10/23/2015  Glucose 70 - 99 mg/dL 124(H) 88 135(H)  BUN 6 - 20 mg/dL 11 9 8   Creatinine 0.61 - 1.24 mg/dL 0.70 0.77 0.94  Sodium 135 - 145 mmol/L 138 138 135  Potassium 3.5 - 5.1 mmol/L 4.0 4.1 3.9  Chloride 98 - 111 mmol/L 106 104 100(L)  CO2 22 - 32 mmol/L 25 26 24   Calcium 8.9 - 10.3 mg/dL 9.2 9.0 9.6  Total Protein 6.5 - 8.1 g/dL 8.0 6.7 8.0  Total Bilirubin 0.3 - 1.2 mg/dL 0.8 0.6 1.0  Alkaline Phos 38 - 126 U/L 75 58 103  AST 15 - 41 U/L 42(H) 36 46(H)  ALT 0 - 44 U/L 47(H) 26 36   Lipid Panel  No results found for: CHOL, TRIG, HDL, CHOLHDL, VLDL, LDLCALC, LDLDIRECT  CBC    Component Value Date/Time   WBC 7.7 11/27/2020 1029   RBC 5.10 11/27/2020 1029   HGB 15.1 11/27/2020 1029   HCT 44.7 11/27/2020 1029   PLT 283 11/27/2020 1029   MCV 87.6 11/27/2020 1029   MCH  29.6 11/27/2020 1029   MCHC 33.8 11/27/2020 1029   RDW 12.1 11/27/2020 1029   LYMPHSABS 2.4 11/27/2020 1029   MONOABS 0.6 11/27/2020 1029   EOSABS 0.1 11/27/2020 1029   BASOSABS 0.0 11/27/2020 1029    ASSESSMENT AND PLAN: 1. Acute bilateral low back pain without sciatica 2. Closed fracture of sternum, unspecified portion of sternum,  initial encounter -Patient reports that he is slowly getting better.  He will continue physical therapy and concentrate on continuing to get better.  We will plan to release him to return to work once he is meet his goals with therapist.  He shows me his therapy session schedule which she has with him today.  Right now it is extended out until 23 February.  I will have him follow-up with me the subsequent day.  At this time we will extend his short-term disability until the end of February.  I will complete the form which he gave me today.  3. Essential hypertension Wrongly advised Dash diet.  Recommend starting amlodipine 5 mg daily.  Recommend that he checks his blood pressure at least twice a week with goal being 130/80 or lower. - amLODipine (NORVASC) 5 MG tablet; Take 1 tablet (5 mg total) by mouth daily.  Dispense: 30 tablet; Refill: 4  4. Elevated LFTs Went over how much alcohol is too much for male.  He should not be drinking more than 2 standard beers a day and preferably even less.  He is agreeable to having his liver function test rechecked along with hepatitis C screening. - Hepatic Function Panel - HCV Ab w Reflex to Quant PCR  5. Need for hepatitis C screening test See #4 above.  6. HIV screening declined Patient declined recommended one-time screening for HIV.  Does not feel he is at risk.  7. Elevated blood sugar - Hemoglobin A1c  8. Family history of prostate cancer We have agreed to do the PSA screening given his strong family history. - PSA  9. Screening for colon cancer Due for colon cancer screening.  We discussed methods.  He  prefers to have the Cologuard test.  He is at average risk for colon cancer. - Cologuard    Patient was given the opportunity to ask questions.  Patient verbalized understanding of the plan and was able to repeat key elements of the plan.   No orders of the defined types were placed in this encounter.    Requested Prescriptions    No prescriptions requested or ordered in this encounter    No follow-ups on file.  Karle Plumber, MD, FACP

## 2021-01-16 ENCOUNTER — Ambulatory Visit: Payer: BC Managed Care – PPO

## 2021-01-16 DIAGNOSIS — M6281 Muscle weakness (generalized): Secondary | ICD-10-CM

## 2021-01-16 DIAGNOSIS — R0789 Other chest pain: Secondary | ICD-10-CM

## 2021-01-16 DIAGNOSIS — M545 Low back pain, unspecified: Secondary | ICD-10-CM

## 2021-01-16 DIAGNOSIS — M5386 Other specified dorsopathies, lumbar region: Secondary | ICD-10-CM

## 2021-01-16 DIAGNOSIS — R7989 Other specified abnormal findings of blood chemistry: Secondary | ICD-10-CM | POA: Insufficient documentation

## 2021-01-16 LAB — HEPATIC FUNCTION PANEL
ALT: 45 IU/L — ABNORMAL HIGH (ref 0–44)
AST: 47 IU/L — ABNORMAL HIGH (ref 0–40)
Albumin: 4.3 g/dL (ref 4.0–5.0)
Alkaline Phosphatase: 79 IU/L (ref 44–121)
Bilirubin Total: 0.5 mg/dL (ref 0.0–1.2)
Bilirubin, Direct: 0.18 mg/dL (ref 0.00–0.40)
Total Protein: 7 g/dL (ref 6.0–8.5)

## 2021-01-16 LAB — HCV INTERPRETATION

## 2021-01-16 LAB — HCV AB W REFLEX TO QUANT PCR: HCV Ab: <0.1 s/co ratio (ref 0.0–0.9)

## 2021-01-16 LAB — HEMOGLOBIN A1C
Est. average glucose Bld gHb Est-mCnc: 97 mg/dL
Hgb A1c MFr Bld: 5 % (ref 4.8–5.6)

## 2021-01-16 LAB — PSA: Prostate Specific Ag, Serum: 0.6 ng/mL (ref 0.0–4.0)

## 2021-01-16 NOTE — Therapy (Signed)
OUTPATIENT PHYSICAL THERAPY TREATMENT NOTE     Patient Name: Donald Cherry MRN: [id number] DOB:11-14-1975, 46 y.o., male Today's Date: 01/11/2021   PCP: Patient, No Pcp Per (Inactive) REFERRING PROVIDER: Ladell Pier, MD     PT End of Session - 01/11/21 1152       Visit Number 6    Number of Visits 17     Date for PT Re-Evaluation 03/02/21     Authorization Type BCBS COMM PPO Reassess FOTO on 6th and 11th visits     PT Start Time 1149     PT Stop Time 1233    PT Time Calculation (min) 50mn     Activity Tolerance Patient tolerated treatment well     [facility]                      History reviewed. No pertinent past medical history. History reviewed. No pertinent surgical history.     Patient Active Problem List    Diagnosis Date Noted   Acute bilateral low back pain without sciatica 12/16/2020   Essential hypertension 12/16/2020   Gastroesophageal reflux disease without esophagitis 12/04/2020   Aortic atherosclerosis ([place] 12/04/2020   Elevated blood pressure reading in office without diagnosis of hypertension 12/04/2020   History of tobacco abuse 12/04/2020   Pulmonary [place]ysema ([place] 12/04/2020   BMI 20.0-20.9, adult 12/04/2020    REFERRING PROVIDER: JLadell Pier MD    REFERRING DIAG: Chest pain, mid sternal. Acute bilateral low back pain without sciatic    ONSET DATE: 11/26/20   THERAPY DIAG:  Acute bilateral low back pain without sciatica   Chest pain, mid sternal   Muscle weakness (generalized)   PERTINENT HISTORY: PERTINENT HISTORY:  NA per SnapShot and pt report   PRECAUTIONS: PRECAUTIONS: Sternal- Pt reports being told to limit activity, complete as tolerated   WEIGHT BEARING RESTRICTIONS No   SUBJECTIVE: Pt reports the estim was helpful for his low back the last session. Overall, he feels he is improving. He notes he saw his PCP yesterday and he has been placed out of work until the end of PT.    PAIN:  Are you having pain? Yes VAS scale: 3/10 for low back L>R, 4/10 for strenum and chest ( pt notes having heartburn today) Pain location: lower back and chest Pain orientation: Lower back, sternum PAIN TYPE: constant Pain description: sore  Aggravating factors: bending over, getting up and lifting Relieving factors: lay down, heat        OBJECTIVE:    DIAGNOSTIC FINDINGS:  IMPRESSION: 1. Osteopenia and degenerative change without evidence of acute fracture. 2. 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   IMPRESSION: 1. Subtle nondisplaced fracture of the proximal sternum, approximately 3 cm inferior to the sternomanubrial junction on lateral projection.     PATIENT SURVEYS:  FOTO 37% perceived functional abiltiy, 01/16/21=54%   SCREENING FOR RED FLAGS: Bowel or bladder incontinence: No   COGNITION:          Overall cognitive status: Within functional limits for tasks assessed                        SENSATION:          Light touch: Appears intact   MUSCLE LENGTH: Hamstrings: Right TBA deg; Left TBA deg   POSTURE:  Forward head,  rounded shoulders, decreased thoracic, decreased lordosis   PALPATION: TTP for the lumbar paraspinals   LUMBARAROM/PROM   A/PROM A/PROM  12/28/2020  Flexion Mod limited, hands to knees, provoked LBP  Extension Mod limited, most significantly provoked L LBP and LE pain    Right lateral flexion Mod limited, provoked L LBP and LE pain  Left lateral flexion Mod limited, provoked L LBP and LE pain  Right rotation Min limited, provoked R LBP and LE pain  Left rotation Min limited, provoked L LBP and LE pain   (Blank rows = not tested)   LE AROM/PROM:   A/PROM Right 12/28/2020 Left 12/28/2020  Hip flexion      Hip extension      Hip abduction      Hip adduction      Hip internal rotation      Hip external rotation      Knee flexion      Knee  extension      Ankle dorsiflexion      Ankle plantarflexion      Ankle inversion      Ankle eversion       (Blank rows = not tested)   LE MMT:   MMT Right 12/28/2020 Left 12/28/2020  Hip flexion 4+ 4  Hip extension 4+ 4  Hip abduction 4+ 4  Hip adduction 5 5  Hip internal rotation 5 4+  Hip external rotation 4+ 4  Knee flexion 5 5  Knee extension 5 5  Ankle dorsiflexion 5 5  Ankle plantarflexion 5 5  Ankle inversion      Ankle eversion       (Blank rows = not tested) MMT for both hips was limited due to pain per pt's reports, L more so than R     LUMBAR SPECIAL TESTS:  Slump test: Negative SI Comp-positive/distraction-negative   FUNCTIONAL TESTS:  5 times sit to stand: 01/03/21: 68.42 sec (with UE assist)  6 minute walk test: 01/03/21: 748 feet (pain increased from 3/10 to 5/10)   GAIT: Distance walked: In clinic Assistive device utilized: None Level of assistance: Complete Independence Comments: Pt walks at a decrease pace             TRANSFERS: Sit stand decreased pain with report of pain               Eval for sternal pain 01/05/21               ROM: Shoulder ROMs were found to be symmetrical and WNLs. ER to T6 bilat. IR to T10 bilat.               Strength: Testing was modified limiting resistance related to pt's report of sternal discomfort. B shoulders were found to be 4/5   [facility] Adult PT Treatment:                                                DATE: 01/16/21 Therapeutic Exercise: Standing hip ext in min trunk flexion, alt, x20 Shoulder row, GTB, 2x15 Shoulder ext, GTB, 2x15 STS, 20" height, x10, less labored Hip hinge lift c dowel, x10 Hip, hinge lifting c 10#, from 18'' Chest/serratus press, 2x15 RTB Seated lumbar flexion forward and laterally with physioball 10 sec x 5  Self care: Review of FOTO with pt making good progress c perceived function  [facility] Adult PT Treatment:                                                DATE:  01/11/21 Therapeutic Exercise:   Standing hip ext in min trunk flexion, alt, x15 Shoulder row, RTB, 2x15 Shoulder ext, RTB, 2x15 STS, 25" height, x10, labored Shoulder flex to 90d, 2x10, 2# Chest/serratus press, 2x10, YTB Seated lumbar flexion forward and laterally with physioball 10 sec x 5   Modalities: Estim: IFC to the bilat low back for pain relief ,intensity 18, strong but comfortable for pt.,15 mins, with moist heat   Not completed this PT session: LTR x 10 with cues for comfortable ROM SKTC 3 x 30sec Mod Thomas stretch,  1x30" Bridging x15 , 5" PPT x15, 3 sec Prone hip ext, 2x10                 [facility] Adult PT Treatment:        DATE: 01/09/2021   Therapeutic Exercise:   Nu- Step, 5 mins, L4, UE/LE   Seated lumbar flexion forward and laterally with physioball 10 sec x 5 LTR x 10 with cues for comfortable ROM SKTC 3 x 30sec Mod Thomas stretch,  1x30" Bridging x15 , 5" PPT x15, 3 sec Prone hip ext, 2x10   Manual Therapy: With palpation to the lumbar paraspinals pt was tender L with muscle spasms      [facility] Adult PT Treatment:        DATE: 01/05/21 Therapeutic Exercise: Shoulder ER x10 YTB Shoulder rows x10 YTB Shoulder ext x10 YTB Chest press x10 YTB 90/90 chest stretch x2, 30" gentle Bridging x10 PPT x10, 3 sec Hip clams GTB 10x Hip add sets c ball squeeze, 3 " Seated lumbar flexion forward and laterally with physioball 10 sec x 5, ea   Self Care: HEP for chest/UB and progression c low back exs, see below     TODAY'S TREATMENT 01/03/21:              - 6MWT captured at 748 feet             -Review of HEP: SKTC 3 x 30sec, bridge 10 x 2, PPT 10 x 2              -LTR x 10 with cues for comfortable ROM             - 5 X STS               -seated lumbar flexion with physioball 10 sec x 10             Modalities: HMP to lumbar with feet on tall bolster   12/28/20 EVAL/Treatment - HL Bridging partial x10, 3"          - HL PPT c min hand reach toward kness for  abdominal activation x10, 3"          - HL posterior arm presses for posterior trunk strengthening x10, 3"          - HL SKTC, x3, 15"             PATIENT EDUCATION:  Education details: FOTO score and predictions Person educated: Patient Education method: Explanation, Demonstration, Tactile cues, Verbal cues, and Handouts Education comprehension: verbalized understanding, returned demonstration, verbal cues required, and tactile cues required  HOME EXERCISE PROGRAM:             Access Code: [email or web address] URL: [email or web address] Date: 01/05/2021 Prepared by: Gar Ponto   Exercises Supine Bridge - 2 x daily - 7 x weekly - 1 sets - 10 reps - 3 hold Supine Posterior Pelvic Tilt - 2 x daily - 7 x weekly - 1 sets - 10 reps - 3 hold Hooklying Single Knee to Chest Stretch - 2 x daily - 7 x weekly - 1 sets - 3 reps - 15 hold Hooklying Clamshell with Resistance - 1 x daily - 7 x weekly - 1 sets - 10 reps - 3 hold Supine Hip Adduction Isometric with Ball - 1 x daily - 7 x weekly - 1 sets - 10 reps - 3 hold Standing Shoulder Row with Anchored Resistance - 1 x daily - 7 x weekly - 1 sets - 10 reps - 3 hold Shoulder Extension with Resistance - 1 x daily - 7 x weekly - 1 sets - 10 reps - 3 hold Shoulder External Rotation and Scapular Retraction with Resistance - 1 x daily - 7 x weekly - 1 sets - 10 reps - 3 hold Standing Serratus Punch with Resistance - 1 x daily - 7 x weekly - 1 sets - 10 reps - 3 hold Doorway Pec Stretch at 90 Degrees Abduction - 1 x daily - 7 x weekly - 1 sets - 3 reps - 20 hold     ASSESSMENT:   CLINICAL IMPRESSION: Pt is making appropriate progress re: pain and function. With reassessment of FOTO, pt's perceived LOF is improving. PT today was completed for hinged hip lifting training with lifting completed with low weight. Flexibility and strengthening was completed for his back and strengthening for the chest with progression in resistance levels for the  therex. Pt tolerated today's session without adverse effects.     REHAB POTENTIAL: Good   CLINICAL DECISION MAKING: Stable/uncomplicated   EVALUATION COMPLEXITY: Low     GOALS:     SHORT TERM GOALS:   STG Name Target Date Goal status  1 Pt will be ind in an initial HEP Baseline: started on eval 01/19/21 INITIAL  2 Pt will voice understanding of measures to assist in the reductio of pain Baseline:  01/19/21 INITIAL  3 Complete eval for sternal pain and set goals 01/19/21 INITIAL    LONG TERM GOALS:    LTG Name Target Date Goal status  1 Improve pt's trunk ROM to minimal limitation for improved function Baseline: Moderate limitations 03/02/21 INITIAL  2 Improve bilat hip strength to 4+/5 or greater for improved function Baseline: See flowsheet 03/02/21 INITIAL  3 Pt will report a decrease in LBP and sternal to 3/10 or less c daily and work related activity  Baseline:low back 0-6/10. Sternum 4-8/10 03/02/21 INITIAL  4 Pt will be ind in proper lifting techniques for return to work Baseline: 03/02/21 INITIAL  5 Increase FOTO perceived functional ability score to predicted value of 62% Baseline: 03/02/21 INITIAL  6 Determine physical demands and sets to help determine readiness for return to work. 01/09/21: Will be demonstrate proper technique for floor to waist lifting of 40# for 2x10 reps and of 20# from waist to shoulder height 2x10 to demonstrate functional abilities for return to work. Baseline: 03/02/21 INITIAL  7  Pt will demostrate 5/5 strength for B UEs Baseline: 03/02/21  INITIAL     PLAN: PT FREQUENCY: 2x/week   PT DURATION: 8 weeks  PLANNED INTERVENTIONS: Therapeutic exercises, Therapeutic activity, Neuro Muscular re-education, Balance training, Gait training, Patient/Family education, Joint mobilization, Dry Needling, Electrical stimulation, Spinal mobilization, Cryotherapy, Moist heat, Taping, Traction, Ultrasound, Ionotophoresis 24m/ml Dexamethasone, and Manual therapy    PLAN FOR NEXT SESSION: set goals for  5xSTS and 6MWT (see functional tests), progress therex demand including lifting as tolerated, progress HEP as indicated       ALiberty MutualMS, PT 01/16/21 1:09 PM

## 2021-01-17 NOTE — Therapy (Signed)
OUTPATIENT PHYSICAL THERAPY TREATMENT NOTE     Patient Name: Donald Cherry MRN: [id number] DOB:12/23/1975, 46 y.o., male Today's Date: 01/11/2021   PCP: Patient, No Pcp Per (Inactive) REFERRING PROVIDER: Ladell Pier, MD     PT End of Session - 01/18/21 1152       Visit Number 7    Number of Visits 17     Date for PT Re-Evaluation 03/02/21     Authorization Type BCBS COMM PPO Reassess FOTO on 11th visits     PT Start Time 1335    PT Stop Time 1415    PT Time Calculation (min) 40 min     Activity Tolerance Patient tolerated treatment well     Behavior During Therapy [facility] for tasks assessed                      History reviewed. No pertinent past medical history. History reviewed. No pertinent surgical history.     Patient Active Problem List    Diagnosis Date Noted   Acute bilateral low back pain without sciatica 12/16/2020   Essential hypertension 12/16/2020   Gastroesophageal reflux disease without esophagitis 12/04/2020   Aortic atherosclerosis ([place]) 12/04/2020   Elevated blood pressure reading in office without diagnosis of hypertension 12/04/2020   History of tobacco abuse 12/04/2020   Pulmonary emphysema ([place]) 12/04/2020   BMI 20.0-20.9, adult 12/04/2020    REFERRING PROVIDER: Ladell Pier, MD    REFERRING DIAG: Chest pain, mid sternal. Acute bilateral low back pain without sciatic    ONSET DATE: 11/26/20   THERAPY DIAG:  Acute bilateral low back pain without sciatica   Chest pain, mid sternal   Muscle weakness (generalized)   PERTINENT HISTORY: PERTINENT HISTORY:  NA per SnapShot and pt report   PRECAUTIONS: PRECAUTIONS: Sternal- Pt reports being told to limit activity, complete as tolerated   WEIGHT BEARING RESTRICTIONS No   SUBJECTIVE: Pt reports his low back is tight. He notes he is exercising and walking a lot at home which may be causing the low aggravation of his low back.   PAIN:  Are you having pain? Yes VAS scale: 4/10 for  low back L>R, 0/10 for strenum and chest Pain location: lower back and chest Pain orientation: Lower back, sternum PAIN TYPE: constant Pain description: sore  Aggravating factors: bending over, getting up and lifting Relieving factors: lay down, heat        OBJECTIVE:    DIAGNOSTIC FINDINGS:  IMPRESSION: 1. Osteopenia and degenerative change without evidence of acute fracture. 2. 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   IMPRESSION: 1. Subtle nondisplaced fracture of the proximal sternum, approximately 3 cm inferior to the sternomanubrial junction on lateral projection.     PATIENT SURVEYS:  FOTO 37% perceived functional abiltiy, 01/16/21=54%   SCREENING FOR RED FLAGS: Bowel or bladder incontinence: No   COGNITION:          Overall cognitive status: Within functional limits for tasks assessed                        SENSATION:          Light touch: Appears intact   MUSCLE LENGTH: Hamstrings: Right TBA deg; Left TBA deg   POSTURE:  Forward head, rounded shoulders, decreased thoracic, decreased lordosis   PALPATION: TTP for the lumbar paraspinals   LUMBARAROM/PROM  A/PROM A/PROM  12/28/2020  Flexion Mod limited, hands to knees, provoked LBP  Extension Mod limited, most significantly provoked L LBP and LE pain    Right lateral flexion Mod limited, provoked L LBP and LE pain  Left lateral flexion Mod limited, provoked L LBP and LE pain  Right rotation Min limited, provoked R LBP and LE pain  Left rotation Min limited, provoked L LBP and LE pain   (Blank rows = not tested)   LE AROM/PROM:   A/PROM Right 12/28/2020 Left 12/28/2020  Hip flexion      Hip extension      Hip abduction      Hip adduction      Hip internal rotation      Hip external rotation      Knee flexion      Knee extension      Ankle dorsiflexion      Ankle plantarflexion      Ankle inversion       Ankle eversion       (Blank rows = not tested)   LE MMT:   MMT Right 12/28/2020 Left 12/28/2020  Hip flexion 4+ 4  Hip extension 4+ 4  Hip abduction 4+ 4  Hip adduction 5 5  Hip internal rotation 5 4+  Hip external rotation 4+ 4  Knee flexion 5 5  Knee extension 5 5  Ankle dorsiflexion 5 5  Ankle plantarflexion 5 5  Ankle inversion      Ankle eversion       (Blank rows = not tested) MMT for both hips was limited due to pain per pt's reports, L more so than R     LUMBAR SPECIAL TESTS:  Slump test: Negative SI Comp-positive/distraction-negative   FUNCTIONAL TESTS:  5 times sit to stand: 01/03/21: 68.42 sec (with UE assist)  6 minute walk test: 01/03/21: 748 feet (pain increased from 3/10 to 5/10)   GAIT: Distance walked: In clinic Assistive device utilized: None Level of assistance: Complete Independence Comments: Pt walks at a decrease pace             TRANSFERS: Sit stand decreased pain with report of pain               Eval for sternal pain 01/05/21               ROM: Shoulder ROMs were found to be symmetrical and WNLs. ER to T6 bilat. IR to T10 bilat.               Strength: Testing was modified limiting resistance related to pt's report of sternal discomfort. B shoulders were found to be 4/5   [facility] Adult PT Treatment:                                                DATE: 01/18/21  Therapeutic Exercise: Nu-step 64mn, L5, UE/LE    Standing hip ext in min trunk flexion, alt, x20 Shoulder row, GTB, 2x15 Shoulder ext, GTB, 2x15 STS, 18" height, x10, less labored Hip hinge lift c dowel, x10 Hip, hinge lifting c 10#, from 18'' Chest/serratus press, 2x15 RTB Seated lumbar flexion forward and laterally with physioball 10 sec x 5 Prone plank, x10, 5" Waist to shoulder height lifts, x10, L and R with 3# and then 5#  Prone leg lifts x20 Prone alt  arm/leg lifts x20  Self care: Use of tennis bali and theracane to address low back tightness     [facility] Adult PT  Treatment:                                                DATE: 01/16/21 Therapeutic Exercise: Standing hip ext in min trunk flexion, alt, x20 Shoulder row, GTB, 2x15 Shoulder ext, GTB, 2x15 STS, 20" height, x10, less labored Hip hinge lift c dowel, x10 Hip, hinge lifting c 10#, from 18'' Chest/serratus press, 2x15 RTB Seated lumbar flexion forward and laterally with physioball 10 sec x 5   Self care: Review of FOTO with pt making good progress c perceived function      [facility] Adult PT Treatment:                                                DATE: 01/11/21 Therapeutic Exercise:   Standing hip ext in min trunk flexion, alt, x15 Shoulder row, RTB, 2x15 Shoulder ext, RTB, 2x15 STS, 25" height, x10, labored Shoulder flex to 90d, 2x10, 2# Chest/serratus press, 2x10, YTB Seated lumbar flexion forward and laterally with physioball 10 sec x 5   Modalities: Estim: IFC to the bilat low back for pain relief ,intensity 18, strong but comfortable for pt.,15 mins, with moist heat   Not completed this PT session: LTR x 10 with cues for comfortable ROM SKTC 3 x 30sec Mod Thomas stretch,  1x30" Bridging x15 , 5" PPT x15, 3 sec Prone hip ext, 2x10                 [facility] Adult PT Treatment:        DATE: 01/09/2021   Therapeutic Exercise:   Nu- Step, 5 mins, L4, UE/LE   Seated lumbar flexion forward and laterally with physioball 10 sec x 5 LTR x 10 with cues for comfortable ROM SKTC 3 x 30sec Mod Thomas stretch,  1x30" Bridging x15 , 5" PPT x15, 3 sec Prone hip ext, 2x10   Manual Therapy: With palpation to the lumbar paraspinals pt was tender L with muscle spasms      [facility] Adult PT Treatment:        DATE: 01/05/21 Therapeutic Exercise: Shoulder ER x10 YTB Shoulder rows x10 YTB Shoulder ext x10 YTB Chest press x10 YTB 90/90 chest stretch x2, 30" gentle Bridging x10 PPT x10, 3 sec Hip clams GTB 10x Hip add sets c ball squeeze, 3 " Seated lumbar flexion forward and laterally with  physioball 10 sec x 5, ea   Self Care: HEP for chest/UB and progression c low back exs, see below     TODAY'S TREATMENT 01/03/21:              - 6MWT captured at 748 feet             -Review of HEP: SKTC 3 x 30sec, bridge 10 x 2, PPT 10 x 2              -LTR x 10 with cues for comfortable ROM             - 5 X STS               -  seated lumbar flexion with physioball 10 sec x 10             Modalities: HMP to lumbar with feet on tall bolster   12/28/20 EVAL/Treatment - HL Bridging partial x10, 3"          - HL PPT c min hand reach toward kness for abdominal activation x10, 3"          - HL posterior arm presses for posterior trunk strengthening x10, 3"          - HL SKTC, x3, 15"             PATIENT EDUCATION:  Education details: FOTO score and predictions Person educated: Patient Education method: Explanation, Demonstration, Tactile cues, Verbal cues, and Handouts Education comprehension: verbalized understanding, returned demonstration, verbal cues required, and tactile cues required     HOME EXERCISE PROGRAM:             Access Code: [email or web address] URL: [email or web address] Date: 01/05/2021 Prepared by: Gar Ponto   Exercises Supine Bridge - 2 x daily - 7 x weekly - 1 sets - 10 reps - 3 hold Supine Posterior Pelvic Tilt - 2 x daily - 7 x weekly - 1 sets - 10 reps - 3 hold Hooklying Single Knee to Chest Stretch - 2 x daily - 7 x weekly - 1 sets - 3 reps - 15 hold Hooklying Clamshell with Resistance - 1 x daily - 7 x weekly - 1 sets - 10 reps - 3 hold Supine Hip Adduction Isometric with Ball - 1 x daily - 7 x weekly - 1 sets - 10 reps - 3 hold Standing Shoulder Row with Anchored Resistance - 1 x daily - 7 x weekly - 1 sets - 10 reps - 3 hold Shoulder Extension with Resistance - 1 x daily - 7 x weekly - 1 sets - 10 reps - 3 hold Shoulder External Rotation and Scapular Retraction with Resistance - 1 x daily - 7 x weekly - 1 sets - 10 reps - 3 hold Standing Serratus  Punch with Resistance - 1 x daily - 7 x weekly - 1 sets - 10 reps - 3 hold Doorway Pec Stretch at 90 Degrees Abduction - 1 x daily - 7 x weekly - 1 sets - 3 reps - 20 hold     ASSESSMENT:   CLINICAL IMPRESSION: PT was completed for lumbopelvic flexibility and strengthening/stability therex. Greater focus was place on posterior chain strengthening. Pt tolerated today's session without adverse effects. CP/MH was offered after therex for symptom management of low back. Pt stated he would use heat on his low back at home.     REHAB POTENTIAL: Good   CLINICAL DECISION MAKING: Stable/uncomplicated   EVALUATION COMPLEXITY: Low     GOALS:     SHORT TERM GOALS:   STG Name Target Date Goal status  1 Pt will be ind in an initial HEP Baseline: started on eval 01/19/21 INITIAL  2 Pt will voice understanding of measures to assist in the reductio of pain Baseline:  01/19/21 INITIAL  3 Complete eval for sternal pain and set goals 01/19/21 INITIAL    LONG TERM GOALS:    LTG Name Target Date Goal status  1 Improve pt's trunk ROM to minimal limitation for improved function Baseline: Moderate limitations 03/02/21 INITIAL  2 Improve bilat hip strength to 4+/5 or greater for improved function Baseline: See flowsheet 03/02/21 INITIAL  3 Pt will report a decrease in LBP  and sternal to 3/10 or less c daily and work related activity  Baseline:low back 0-6/10. Sternum 4-8/10 03/02/21 INITIAL  4 Pt will be ind in proper lifting techniques for return to work Baseline: 03/02/21 INITIAL  5 Increase FOTO perceived functional ability score to predicted value of 62% Baseline: 03/02/21 INITIAL  6 Determine physical demands and sets to help determine readiness for return to work. 01/09/21: Will be demonstrate proper technique for floor to waist lifting of 40# for 2x10 reps and of 20# from waist to shoulder height 2x10 to demonstrate functional abilities for return to work. Baseline: 03/02/21 INITIAL  7  Pt will  demostrate 5/5 strength for B UEs Baseline: 03/02/21  INITIAL     PLAN: PT FREQUENCY: 2x/week   PT DURATION: 8 weeks   PLANNED INTERVENTIONS: Therapeutic exercises, Therapeutic activity, Neuro Muscular re-education, Balance training, Gait training, Patient/Family education, Joint mobilization, Dry Needling, Electrical stimulation, Spinal mobilization, Cryotherapy, Moist heat, Taping, Traction, Ultrasound, Ionotophoresis 42m/ml Dexamethasone, and Manual therapy   PLAN FOR NEXT SESSION: set goals for  5xSTS and 6MWT (see functional tests), progress therex demand including lifting as tolerated, progress HEP as indicated     [employer]MS, PT 01/19/21 6:25 AM

## 2021-01-18 ENCOUNTER — Ambulatory Visit: Payer: BC Managed Care – PPO

## 2021-01-18 ENCOUNTER — Other Ambulatory Visit: Payer: Self-pay

## 2021-01-18 ENCOUNTER — Inpatient Hospital Stay: Payer: 59 | Admitting: Internal Medicine

## 2021-01-18 DIAGNOSIS — M545 Low back pain, unspecified: Secondary | ICD-10-CM

## 2021-01-18 DIAGNOSIS — R0789 Other chest pain: Secondary | ICD-10-CM

## 2021-01-18 DIAGNOSIS — M6281 Muscle weakness (generalized): Secondary | ICD-10-CM

## 2021-01-18 DIAGNOSIS — M5386 Other specified dorsopathies, lumbar region: Secondary | ICD-10-CM

## 2021-01-18 NOTE — Telephone Encounter (Signed)
Pt was called and informed of paperwork being faxed and original copies are upfront for pick up.

## 2021-01-18 NOTE — Telephone Encounter (Signed)
Copied from [place] [phone]. Topic: General - Other >> Jan 18, 2021 12:35 PM Fields, Museum/gallery conservator R wrote: Reason for CRM: pt cheking to see if he can pick up forms that pcp filled out for him, requesting a call back at [phone]

## 2021-01-23 ENCOUNTER — Other Ambulatory Visit: Payer: Self-pay

## 2021-01-23 ENCOUNTER — Ambulatory Visit: Payer: BC Managed Care – PPO

## 2021-01-23 DIAGNOSIS — M6281 Muscle weakness (generalized): Secondary | ICD-10-CM

## 2021-01-23 DIAGNOSIS — M545 Low back pain, unspecified: Secondary | ICD-10-CM | POA: Diagnosis not present

## 2021-01-23 DIAGNOSIS — R0789 Other chest pain: Secondary | ICD-10-CM

## 2021-01-23 DIAGNOSIS — M5386 Other specified dorsopathies, lumbar region: Secondary | ICD-10-CM

## 2021-01-23 NOTE — Therapy (Signed)
OUTPATIENT PHYSICAL THERAPY TREATMENT NOTE     Patient Name: Donald Cherry MRN: [id number] DOB:[birth year], 46 y.o., male Today's Date: 01/11/2021   PCP: Patient, No Pcp Per (Inactive) REFERRING PROVIDER: Ladell Pier, MD     PT End of Session - 01/23/21 1152       Visit Number 8    Number of Visits 17     Date for PT Re-Evaluation 03/02/21     Authorization Type BCBS COMM PPO Reassess FOTO on 11th visits     PT Start Time 1109    PT Stop Time 1150    PT Time Calculation (min) 41 min     Activity Tolerance Patient tolerated treatment well     Behavior During Therapy [facility] for tasks assessed                      History reviewed. No pertinent past medical history. History reviewed. No pertinent surgical history.     Patient Active Problem List    Diagnosis Date Noted   Acute bilateral low back pain without sciatica 12/16/2020   Essential hypertension 12/16/2020   Gastroesophageal reflux disease without esophagitis 12/04/2020   Aortic atherosclerosis ([place]) 12/04/2020   Elevated blood pressure reading in office without diagnosis of hypertension 12/04/2020   History of tobacco abuse 12/04/2020   Pulmonary emphysema ([place]) 12/04/2020   BMI 20.0-20.9, adult 12/04/2020    REFERRING PROVIDER: Ladell Pier, MD    REFERRING DIAG: Chest pain, mid sternal. Acute bilateral low back pain without sciatic    ONSET DATE: 11/26/20   THERAPY DIAG:  Acute bilateral low back pain without sciatica   Chest pain, mid sternal   Muscle weakness (generalized)   PERTINENT HISTORY: PERTINENT HISTORY:  NA per SnapShot and pt report   PRECAUTIONS: PRECAUTIONS: Sternal- Pt reports being told to limit activity, complete as tolerated   WEIGHT BEARING RESTRICTIONS No   SUBJECTIVE: I've been doing pretty good. Low back is a lot better. Sternum only bother's him if lying on it.    PAIN:  Are you having pain? Yes VAS scale: 3/10 for low back L>R, 0/10 for strenum and chest Pain  location: lower back and chest Pain orientation: Lower back, sternum PAIN TYPE: constant Pain description: sore  Aggravating factors: bending over, getting up and lifting Relieving factors: lay down, heating pad       OBJECTIVE:    DIAGNOSTIC FINDINGS:  IMPRESSION: 1. Osteopenia and degenerative change without evidence of acute fracture. 2. 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   IMPRESSION: 1. Subtle nondisplaced fracture of the proximal sternum, approximately 3 cm inferior to the sternomanubrial junction on lateral projection.     PATIENT SURVEYS:  FOTO 37% perceived functional abiltiy, 01/16/21=54%   SCREENING FOR RED FLAGS: Bowel or bladder incontinence: No   COGNITION:          Overall cognitive status: Within functional limits for tasks assessed                        SENSATION:          Light touch: Appears intact   MUSCLE LENGTH: Hamstrings: Right TBA deg; Left TBA deg   POSTURE:  Forward head, rounded shoulders, decreased thoracic, decreased lordosis   PALPATION: TTP for the lumbar paraspinals   LUMBARAROM/PROM   A/PROM A/PROM  12/28/2020  Flexion Mod limited,  hands to knees, provoked LBP  Extension Mod limited, most significantly provoked L LBP and LE pain    Right lateral flexion Mod limited, provoked L LBP and LE pain  Left lateral flexion Mod limited, provoked L LBP and LE pain  Right rotation Min limited, provoked R LBP and LE pain  Left rotation Min limited, provoked L LBP and LE pain   (Blank rows = not tested)   LE AROM/PROM:   A/PROM Right 12/28/2020 Left 12/28/2020  Hip flexion      Hip extension      Hip abduction      Hip adduction      Hip internal rotation      Hip external rotation      Knee flexion      Knee extension      Ankle dorsiflexion      Ankle plantarflexion      Ankle inversion      Ankle eversion       (Blank rows = not  tested)   LE MMT:   MMT Right 12/28/2020 Left 12/28/2020  Hip flexion 4+ 4  Hip extension 4+ 4  Hip abduction 4+ 4  Hip adduction 5 5  Hip internal rotation 5 4+  Hip external rotation 4+ 4  Knee flexion 5 5  Knee extension 5 5  Ankle dorsiflexion 5 5  Ankle plantarflexion 5 5  Ankle inversion      Ankle eversion       (Blank rows = not tested) MMT for both hips was limited due to pain per pt's reports, L more so than R     LUMBAR SPECIAL TESTS:  Slump test: Negative SI Comp-positive/distraction-negative   FUNCTIONAL TESTS:  5 times sit to stand: 01/03/21: 68.42 sec (with UE assist)  6 minute walk test: 01/03/21: 748 feet (pain increased from 3/10 to 5/10)   GAIT: Distance walked: In clinic Assistive device utilized: None Level of assistance: Complete Independence Comments: Pt walks at a decrease pace             TRANSFERS: Sit stand decreased pain with report of pain               Eval for sternal pain 01/05/21               ROM: Shoulder ROMs were found to be symmetrical and WNLs. ER to T6 bilat. IR to T10 bilat.               Strength: Testing was modified limiting resistance related to pt's report of sternal discomfort. B shoulders were found to be 4/5   [facility] Adult PT Treatment:                                                DATE: 01/23/21  Therapeutic Exercise: Nu-step 7mn, L5, UE/LE    STS, 21" height, x15, 15#  Hip hinge lift c dowel, x10 Hip, hinge lifting c 10#, from 18'' Chest/serratus press, 2x15 GTB Seated lumbar flexion forward and laterally with physioball 10 sec x 5 Prone plank, x10, 5" shoulder flex 90d, 2x10, bliat 5#  Prone leg lifts x20 Prone alt arm/leg lifts x20 Cybex, hip abd, 3x10, 22.5#   [facility] Adult PT Treatment:  DATE: 01/18/21  Therapeutic Exercise: Nu-step 58mn, L5, UE/LE    Standing hip ext in min trunk flexion, alt, x20 Shoulder row, GTB, 2x15 Shoulder ext, GTB, 2x15 STS, 18"  height, x10, less labored Hip hinge lift c dowel, x10 Hip, hinge lifting c 10#, from 18'' Chest/serratus press, 2x15 RTB Seated lumbar flexion forward and laterally with physioball 10 sec x 5 Prone plank, x10, 5" Waist to shoulder height lifts, x10, L and R with 3# and then 5#  Prone leg lifts x20 Prone alt arm/leg lifts x20  Self care: Use of tennis ball and theracane to address low back tightness     [facility] Adult PT Treatment:                                                DATE: 01/16/21 Therapeutic Exercise: Standing hip ext in min trunk flexion, alt, x20 Shoulder row, GTB, 2x15 Shoulder ext, GTB, 2x15 STS, 20" height, x10, less labored Hip hinge lift c dowel, x10 Hip, hinge lifting c 10#, from 18'' Chest/serratus press, 2x15 RTB Seated lumbar flexion forward and laterally with physioball 10 sec x 5   Self care: Review of FOTO with pt making good progress c perceived function      [facility]                                                [facility]: 01[facility]21 Therapeutic Exercise:   Standing hip ext in min trunk flexion, alt, x15 Shoulder row, RTB, 2x15 Shoulder ext, RTB, 2x15 STS, 25" height, x10, labored Shoulder flex to 90d, 2x10, 2# Chest/serratus press, 2x10, YTB Seated lumbar flexion forward and laterally with physioball 10 sec x 5   Modalities: Estim: IFC to the bilat low back for pain relief ,intensity 18, strong but comfortable for pt.,15 mins, with moist heat   Not completed this PT session: LTR x 10 with cues for comfortable ROM SKTC 3 x 30sec Mod Thomas stretch,  1x30" Bridging x15 , 5" PPT x15, 3 sec Prone hip ext, 2x10              PATIENT EDUCATION:  Education details: FOTO score and predictions Person educated: Patient Education method: Explanation, Demonstration, Tactile cues, Verbal cues, and Handouts Education comprehension: verbalized understanding, returned demonstration, verbal cues required, and tactile cues required     HOME  EXERCISE PROGRAM:             Access Code: [email or web address]: [email or web address] Date: 01/05/2021 Prepared by: AGar Ponto  Exercises Supine Bridge - 2 x daily - 7 x weekly - 1 sets - 10 reps - 3 hold Supine Posterior Pelvic Tilt - 2 x daily - 7 x weekly - 1 sets - 10 reps - 3 hold Hooklying Single Knee to Chest Stretch - 2 x daily - 7 x weekly - 1 sets - 3 reps - 15 hold Hooklying Clamshell with Resistance - 1 x daily - 7 x weekly - 1 sets - 10 reps - 3 hold Supine Hip Adduction Isometric with Ball - 1 x daily - 7 x weekly - 1 sets - 10 reps - 3 hold Standing Shoulder Row with Anchored Resistance - 1 x daily - 7  x weekly - 1 sets - 10 reps - 3 hold Shoulder Extension with Resistance - 1 x daily - 7 x weekly - 1 sets - 10 reps - 3 hold Shoulder External Rotation and Scapular Retraction with Resistance - 1 x daily - 7 x weekly - 1 sets - 10 reps - 3 hold Standing Serratus Punch with Resistance - 1 x daily - 7 x weekly - 1 sets - 10 reps - 3 hold Doorway Pec Stretch at 90 Degrees Abduction - 1 x daily - 7 x weekly - 1 sets - 3 reps - 20 hold     ASSESSMENT:   CLINICAL IMPRESSION: PT was completed for lumbopelvic flexibility and strengthening, as well as chest strengthening. Lumbopelvic strengthening also included hinged hip STS and hinged hip lifting for preparation for return to work. Pt continues to report improvement with pain and function with greater demand for strengthening with PT. Pt tolerated PT today without adverse effects. Pt will continue to benefit from PT to address deficits for optimized function with less pain.     REHAB POTENTIAL: Good   CLINICAL DECISION MAKING: Stable/uncomplicated   EVALUATION COMPLEXITY: Low     GOALS:     SHORT TERM GOALS:   STG Name Target Date Goal status  1 Pt will be ind in an initial HEP Baseline: started on eval 01/19/21 INITIAL  2 Pt will voice understanding of measures to assist in the reductio of pain Baseline:   01/19/21 INITIAL  3 Complete eval for sternal pain and set goals 01/19/21 INITIAL    LONG TERM GOALS:    LTG Name Target Date Goal status  1 Improve pt's trunk ROM to minimal limitation for improved function Baseline: Moderate limitations 03/02/21 INITIAL  2 Improve bilat hip strength to 4+/5 or greater for improved function Baseline: See flowsheet 03/02/21 INITIAL  3 Pt will report a decrease in LBP and sternal to 3/10 or less c daily and work related activity  Baseline:low back 0-6/10. Sternum 4-8/10 03/02/21 INITIAL  4 Pt will be ind in proper lifting techniques for return to work Baseline: 03/02/21 INITIAL  5 Increase FOTO perceived functional ability score to predicted value of 62% Baseline: 03/02/21 INITIAL  6 Determine physical demands and sets to help determine readiness for return to work. 01/09/21: Will be demonstrate proper technique for floor to waist lifting of 40# for 2x10 reps and of 20# from waist to shoulder height 2x10 to demonstrate functional abilities for return to work. Baseline: 03/02/21 INITIAL  7  Pt will demostrate 5/5 strength for B UEs Baseline: 03/02/21  INITIAL     PLAN: PT FREQUENCY: 2x/week   PT DURATION: 8 weeks   PLANNED INTERVENTIONS: Therapeutic exercises, Therapeutic activity, Neuro Muscular re-education, Balance training, Gait training, Patient/Family education, Joint mobilization, Dry Needling, Electrical stimulation, Spinal mobilization, Cryotherapy, Moist heat, Taping, Traction, Ultrasound, Ionotophoresis 86m/ml Dexamethasone, and Manual therapy   PLAN FOR NEXT SESSION: set goals for  5xSTS and 6MWT (see functional tests), progress therex demand including lifting as tolerated, progress HEP as indicated. Assess STGs.     Terrianna Holsclaw MS, PT 01/23/21 1:32 PM

## 2021-01-25 ENCOUNTER — Other Ambulatory Visit: Payer: Self-pay

## 2021-01-25 ENCOUNTER — Ambulatory Visit: Payer: BC Managed Care – PPO

## 2021-01-25 DIAGNOSIS — R0789 Other chest pain: Secondary | ICD-10-CM

## 2021-01-25 DIAGNOSIS — M545 Low back pain, unspecified: Secondary | ICD-10-CM

## 2021-01-25 DIAGNOSIS — M5386 Other specified dorsopathies, lumbar region: Secondary | ICD-10-CM

## 2021-01-25 DIAGNOSIS — M6281 Muscle weakness (generalized): Secondary | ICD-10-CM

## 2021-01-25 NOTE — Therapy (Addendum)
OUTPATIENT PHYSICAL THERAPY TREATMENT NOTE     Patient Name: Donald Cherry MRN: [id number] DOB:August 09, 1975, 46 y.o., male Today's Date: 01/11/2021   PCP: Patient, No Pcp Per (Inactive) REFERRING PROVIDER: Ladell Pier, MD     PT End of Session - 01/25/21 1152       Visit Number 9    Number of Visits 17     Date for PT Re-Evaluation 03/02/21     Authorization Type BCBS COMM PPO Reassess FOTO on 11th visits     PT Start Time 1100    PT Stop Time 1143    PT Time Calculation (min) 43 min     Activity Tolerance Patient tolerated treatment well     Behavior During Therapy [facility] for tasks assessed                      History reviewed. No pertinent past medical history. History reviewed. No pertinent surgical history.     Patient Active Problem List    Diagnosis Date Noted   Acute bilateral low back pain without sciatica 12/16/2020   Essential hypertension 12/16/2020   Gastroesophageal reflux disease without esophagitis 12/04/2020   Aortic atherosclerosis ([place]) 12/04/2020   Elevated blood pressure reading in office without diagnosis of hypertension 12/04/2020   History of tobacco abuse 12/04/2020   Pulmonary emphysema ([place]) 12/04/2020   BMI 20.0-20.9, adult 12/04/2020    REFERRING PROVIDER: Ladell Pier, MD    REFERRING DIAG: Chest pain, mid sternal. Acute bilateral low back pain without sciatic    ONSET DATE: 11/26/20   THERAPY DIAG:  Acute bilateral low back pain without sciatica   Chest pain, mid sternal   Muscle weakness (generalized)   PERTINENT HISTORY: PERTINENT HISTORY:  NA per SnapShot and pt report   PRECAUTIONS: PRECAUTIONS: Sternal- Pt reports being told to limit activity, complete as tolerated   WEIGHT BEARING RESTRICTIONS No   SUBJECTIVE: I've been doing pretty good. Low back is a lot better. Sternum only bother's him if lying on it.    PAIN:  Are you having pain? Yes VAS scale: 3/10 for low back L>R, 0/10 for strenum and chest (only  pain if he lies in his stomach) Pain location: lower back and chest Pain orientation: Lower back, sternum PAIN TYPE: constant Pain description: sore  Aggravating factors: bending over, getting up and lifting Relieving factors: lay down, heating pad       OBJECTIVE:    DIAGNOSTIC FINDINGS:  IMPRESSION: 1. Osteopenia and degenerative change without evidence of acute fracture. 2. 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   IMPRESSION: 1. Subtle nondisplaced fracture of the proximal sternum, approximately 3 cm inferior to the sternomanubrial junction on lateral projection.     PATIENT SURVEYS:  FOTO 37% perceived functional abiltiy, 01/16/21=54%   SCREENING FOR RED FLAGS: Bowel or bladder incontinence: No   COGNITION:          Overall cognitive status: Within functional limits for tasks assessed                        SENSATION:          Light touch: Appears intact   MUSCLE LENGTH: Hamstrings: Right TBA deg; Left TBA deg   POSTURE:  Forward head, rounded shoulders, decreased thoracic, decreased lordosis   PALPATION: TTP for the lumbar paraspinals   LUMBARAROM/PROM  A/PROM A/PROM  12/28/2020  Flexion Mod limited, hands to knees, provoked LBP  Extension Mod limited, most significantly provoked L LBP and LE pain    Right lateral flexion Mod limited, provoked L LBP and LE pain  Left lateral flexion Mod limited, provoked L LBP and LE pain  Right rotation Min limited, provoked R LBP and LE pain  Left rotation Min limited, provoked L LBP and LE pain   (Blank rows = not tested)   LE AROM/PROM:   A/PROM Right 12/28/2020 Left 12/28/2020  Hip flexion      Hip extension      Hip abduction      Hip adduction      Hip internal rotation      Hip external rotation      Knee flexion      Knee extension      Ankle dorsiflexion      Ankle plantarflexion      Ankle inversion      Ankle  eversion       (Blank rows = not tested)   LE MMT:   MMT Right 12/28/2020 Left 12/28/2020  Hip flexion 4+ 4  Hip extension 4+ 4  Hip abduction 4+ 4  Hip adduction 5 5  Hip internal rotation 5 4+  Hip external rotation 4+ 4  Knee flexion 5 5  Knee extension 5 5  Ankle dorsiflexion 5 5  Ankle plantarflexion 5 5  Ankle inversion      Ankle eversion       (Blank rows = not tested) MMT for both hips was limited due to pain per pt's reports, L more so than R     LUMBAR SPECIAL TESTS:  Slump test: Negative SI Comp-positive/distraction-negative   FUNCTIONAL TESTS:  5 times sit to stand: 01/03/21: 68.42 sec (with UE assist)  6 minute walk test: 01/03/21: 748 feet (pain increased from 3/10 to 5/10)   GAIT: Distance walked: In clinic Assistive device utilized: None Level of assistance: Complete Independence Comments: Pt walks at a decrease pace             TRANSFERS: Sit stand decreased pain with report of pain               Eval for sternal pain 01/05/21               ROM: Shoulder ROMs were found to be symmetrical and WNLs. ER to T6 bilat. IR to T10 bilat.               Strength: Testing was modified limiting resistance related to pt's report of sternal discomfort. B shoulders were found to be 4/5   [facility] Adult PT Treatment:                                                DATE: 01/25/21  Therapeutic Exercise: Nu-step 57mn, L7, UE/LE  Omega knee ext, 2x10, 25# Omega knee flex, 2x10, 25# Omega chest press, 2x10, 25#  Omega lat pull, 2x10, 25#  Cybex leg press, 2x10, 80# Seated lumbar flexion forward and laterally with physioball 10 sec x 5 Prone plank, x10, 5" Prone leg lifts 2x20 Supine 90/90 hips and shoulders, abdominal bracing, x10, 10 sec   [facility] Adult PT Treatment:  DATE: 01/23/21  Therapeutic Exercise: Nu-step 1mn, L5, UE/LE    STS, 21" height, x15, 15#  Hip hinge lift c dowel, x10 Hip, hinge lifting c 10#, from  18'' Chest/serratus press, 2x15 GTB Seated lumbar flexion forward and laterally with physioball 10 sec x 5 Prone plank, x10, 5" shoulder flex 90d, 2x10, bliat 5#  Prone leg lifts x20 Prone alt arm/leg lifts x20 Cybex, hip abd, 3x10, 22.5#   [facility] Adult PT Treatment:                                                DATE: 01/18/21  Therapeutic Exercise: Nu-step 527m, L5, UE/LE    Standing hip ext in min trunk flexion, alt, x20 Shoulder row, GTB, 2x15 Shoulder ext, GTB, 2x15 STS, 18" height, x10, less labored Hip hinge lift c dowel, x10 Hip, hinge lifting c 10#, from 18'' Chest/serratus press, 2x15 RTB Seated lumbar flexion forward and laterally with physioball 10 sec x 5 Prone plank, x10, 5" Waist to shoulder height lifts, x10, L and R with 3# and then 5#  Prone leg lifts x20 Prone alt arm/leg lifts x20  Self care: Use of tennis ball and theracane to address low back tightness     [facility] Adult PT Treatment:                                                DATE: 01/16/21 Therapeutic Exercise: Standing hip ext in min trunk flexion, alt, x20 Shoulder row, GTB, 2x15 Shoulder ext, GTB, 2x15 STS, 20" height, x10, less labored Hip hinge lift c dowel, x10 Hip, hinge lifting c 10#, from 18'' Chest/serratus press, 2x15 RTB Seated lumbar flexion forward and laterally with physioball 10 sec x 5   Self care: Review of FOTO with pt making good progress c perceived function      [facility]                                                [facility]: [facility] Therapeutic Exercise:   Standing hip ext in min trunk flexion, alt, x15 Shoulder row, RTB, 2x15 Shoulder ext, RTB, 2x15 STS, 25" height, x10, labored Shoulder flex to 90d, 2x10, 2# Chest/serratus press, 2x10, YTB Seated lumbar flexion forward and laterally with physioball 10 sec x 5   Modalities: Estim: IFC to the bilat low back for pain relief ,intensity 18, strong but comfortable for pt.,15 mins, with moist heat   Not  completed this PT session: LTR x 10 with cues for comfortable ROM SKTC 3 x 30sec Mod Thomas stretch,  1x30" Bridging x15 , 5" PPT x15, 3 sec Prone hip ext, 2x10              PATIENT EDUCATION:  Education details: FOTO score and predictions Person educated: Patient Education method: Explanation, Demonstration, Tactile cues, Verbal cues, and Handouts Education comprehension: verbalized understanding, returned demonstration, verbal cues required, and tactile cues required     HOME EXERCISE PROGRAM:             Access Code: [email or web address]: [email or web address] Date: 01/05/2021 Prepared  by: Gar Ponto   Exercises Supine Bridge - 2 x daily - 7 x weekly - 1 sets - 10 reps - 3 hold Supine Posterior Pelvic Tilt - 2 x daily - 7 x weekly - 1 sets - 10 reps - 3 hold Hooklying Single Knee to Chest Stretch - 2 x daily - 7 x weekly - 1 sets - 3 reps - 15 hold Hooklying Clamshell with Resistance - 1 x daily - 7 x weekly - 1 sets - 10 reps - 3 hold Supine Hip Adduction Isometric with Ball - 1 x daily - 7 x weekly - 1 sets - 10 reps - 3 hold Standing Shoulder Row with Anchored Resistance - 1 x daily - 7 x weekly - 1 sets - 10 reps - 3 hold Shoulder Extension with Resistance - 1 x daily - 7 x weekly - 1 sets - 10 reps - 3 hold Shoulder External Rotation and Scapular Retraction with Resistance - 1 x daily - 7 x weekly - 1 sets - 10 reps - 3 hold Standing Serratus Punch with Resistance - 1 x daily - 7 x weekly - 1 sets - 10 reps - 3 hold Doorway Pec Stretch at 90 Degrees Abduction - 1 x daily - 7 x weekly - 1 sets - 3 reps - 20 hold     ASSESSMENT:   CLINICAL IMPRESSION: PT was completed for UB, LB and trunk strengthening in preparation for return to work which is physically demanding. Pt is interested in joining a gym so a portion of today's therex were completed on the weight machines. Pt reports today's session was more physically demanding, but he tolerated without issue. Pt will  continue to benefit from skilled PT to address strength while managing pain associated with his low back and sternum.    REHAB POTENTIAL: Good   CLINICAL DECISION MAKING: Stable/uncomplicated   EVALUATION COMPLEXITY: Low     GOALS:     SHORT TERM GOALS:   STG Name Target Date Goal status  1 Pt will be ind in an initial HEP Baseline: started on eval 01/25/21 Met  2 Pt will voice understanding of measures to assist in the reduction of pain. Heat, cold, massage Baseline:  01/25/21 Met  3 Complete eval for sternal pain and set goals 01/05/22 Met    LONG TERM GOALS:    LTG Name Target Date Goal status  1 Improve pt's trunk ROM to minimal limitation for improved function Baseline: Moderate limitations 03/02/21 INITIAL  2 Improve bilat hip strength to 4+/5 or greater for improved function Baseline: See flowsheet 03/02/21 INITIAL  3 Pt will report a decrease in LBP and sternal to 3/10 or less c daily and work related activity  Baseline:low back 0-6/10. Sternum 4-8/10 03/02/21 INITIAL  4 Pt will be ind in proper lifting techniques for return to work Baseline: 03/02/21 INITIAL  5 Increase FOTO perceived functional ability score to predicted value of 62% Baseline: 03/02/21 INITIAL  6 Determine physical demands and sets to help determine readiness for return to work. 01/09/21: Will be demonstrate proper technique for floor to waist lifting of 40# for 2x10 reps and of 20# from waist to shoulder height 2x10 to demonstrate functional abilities for return to work. Baseline: 03/02/21 INITIAL  7  Pt will demostrate 5/5 strength for B UEs Baseline: 03/02/21  INITIAL     PLAN: PT FREQUENCY: 2x/week   PT DURATION: 8 weeks   PLANNED INTERVENTIONS: Therapeutic exercises, Therapeutic activity, Neuro Muscular re-education,  Balance training, Gait training, Patient/Family education, Joint mobilization, Dry Needling, Electrical stimulation, Spinal mobilization, Cryotherapy, Moist heat, Taping, Traction,  Ultrasound, Ionotophoresis 88m/ml Dexamethasone, and Manual therapy   PLAN FOR NEXT SESSION: set goals for  5xSTS and 6MWT (see functional tests), progress therex demand including lifting as tolerated, progress HEP as indicated. Assess STGs.     Hoby Kawai MS, PT 01/30/21 6:17 AM

## 2021-01-26 ENCOUNTER — Telehealth: Payer: Self-pay | Admitting: Internal Medicine

## 2021-01-26 LAB — COLOGUARD: COLOGUARD: NEGATIVE

## 2021-01-26 NOTE — Telephone Encounter (Signed)
Copied from [place] [phone]. Topic: General - Other >> Jan 25, 2021  2:06 PM Pawlus, Brayton Layman A wrote: Reason for CRM: Pt called in with questions regarding paperwork / disability, pt stated he needs further doctor notes, pt requested a call back.

## 2021-01-26 NOTE — Telephone Encounter (Signed)
Returned pt call. Pt states he was told that his job set him an e-mail in regards to his records. 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 Pt states he understands but his job already approved him. Pt states he doesn't have any other questions or concerns

## 2021-01-30 ENCOUNTER — Ambulatory Visit: Payer: BC Managed Care – PPO

## 2021-01-30 ENCOUNTER — Other Ambulatory Visit: Payer: Self-pay

## 2021-01-30 DIAGNOSIS — M5386 Other specified dorsopathies, lumbar region: Secondary | ICD-10-CM

## 2021-01-30 DIAGNOSIS — M545 Low back pain, unspecified: Secondary | ICD-10-CM

## 2021-01-30 DIAGNOSIS — R0789 Other chest pain: Secondary | ICD-10-CM

## 2021-01-30 DIAGNOSIS — M6281 Muscle weakness (generalized): Secondary | ICD-10-CM

## 2021-01-30 NOTE — Therapy (Signed)
OUTPATIENT PHYSICAL THERAPY TREATMENT NOTE/Progress Note     Patient Name: Donald Cherry MRN: [id number] DOB:02/14/1975, 46 y.o., male Today's Date: 01/11/2021   PCP: Patient, No Pcp Per (Inactive) REFERRING PROVIDER: Ladell Pier, MD  Progress Note Reporting Period 12/28/20 to 01/30/21  See note below for Objective Data and Assessment of Progress/Goals.         PT End of Session - 01/30/21        Visit Number 10    Number of Visits 17     Date for PT Re-Evaluation 03/02/21     Authorization Type BCBS COMM PPO Reassess FOTO on 11th visits     PT Start Time 1105    PT Stop Time 1150    PT Time Calculation (min)     Activity Tolerance Patient tolerated treatment well     Behavior During Therapy [facility] for tasks assessed                      History reviewed. No pertinent past medical history. History reviewed. No pertinent surgical history.     Patient Active Problem List    Diagnosis Date Noted   Acute bilateral low back pain without sciatica 12/16/2020   Essential hypertension 12/16/2020   Gastroesophageal reflux disease without esophagitis 12/04/2020   Aortic atherosclerosis ([place]) 12/04/2020   Elevated blood pressure reading in office without diagnosis of hypertension 12/04/2020   History of tobacco abuse 12/04/2020   Pulmonary emphysema ([place]) 12/04/2020   BMI 20.0-20.9, adult 12/04/2020    REFERRING PROVIDER: Ladell Pier, MD    REFERRING DIAG: Chest pain, mid sternal. Acute bilateral low back pain without sciatic    ONSET DATE: 11/26/20   THERAPY DIAG:  Acute bilateral low back pain without sciatica   Chest pain, mid sternal   Muscle weakness (generalized)   PERTINENT HISTORY: PERTINENT HISTORY:  NA per SnapShot and pt report   PRECAUTIONS: PRECAUTIONS: Sternal- Pt reports being told to limit activity, complete as tolerated   WEIGHT BEARING RESTRICTIONS No   SUBJECTIVE: Pt reports his low back is gradually getting better. Today has  some pain and tightness   PAIN:  Are you having pain? VAS scale: 3/10 for low back L>R, pain range for his low back is 1-4/10. 0/10 for strenum and chest (only pain if he lies in his stomach) Pain location: lower back and chest Pain orientation: Lower back, sternum PAIN TYPE: constant Pain description: sore  Aggravating factors: bending over, getting up and lifting Relieving factors: lay down, heating pad       OBJECTIVE:    DIAGNOSTIC FINDINGS:  IMPRESSION: 1. Osteopenia and degenerative change without evidence of acute fracture. 2. 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   IMPRESSION: 1. Subtle nondisplaced fracture of the proximal sternum, approximately 3 cm inferior to the sternomanubrial junction on lateral projection.     PATIENT SURVEYS:  FOTO 37% perceived functional abiltiy, 01/16/21=54%   SCREENING FOR RED FLAGS: Bowel or bladder incontinence: No   COGNITION:          Overall cognitive status: Within functional limits for tasks assessed                        SENSATION:          Light touch: Appears intact   MUSCLE LENGTH: Hamstrings: Right TBA deg; Left TBA deg  POSTURE:  Forward head, rounded shoulders, decreased thoracic, decreased lordosis   PALPATION: TTP for the lumbar paraspinals   LUMBARAROM/PROM   A/PROM A/PROM  12/28/2020  Flexion Mod limited, hands to knees, provoked LBP  Extension Mod limited, most significantly provoked L LBP and LE pain    Right lateral flexion Mod limited, provoked L LBP and LE pain  Left lateral flexion Mod limited, provoked L LBP and LE pain  Right rotation Min limited, provoked R LBP and LE pain  Left rotation Min limited, provoked L LBP and LE pain   (Blank rows = not tested)   LE AROM/PROM:   A/PROM Right 12/28/2020 Left 12/28/2020  Hip flexion      Hip extension      Hip abduction      Hip adduction      Hip internal  rotation      Hip external rotation      Knee flexion      Knee extension      Ankle dorsiflexion      Ankle plantarflexion      Ankle inversion      Ankle eversion       (Blank rows = not tested)   LE MMT:   MMT Right 12/28/2020 Left 12/28/2020  Hip flexion 4+ 4  Hip extension 4+ 4  Hip abduction 4+ 4  Hip adduction 5 5  Hip internal rotation 5 4+  Hip external rotation 4+ 4  Knee flexion 5 5  Knee extension 5 5  Ankle dorsiflexion 5 5  Ankle plantarflexion 5 5  Ankle inversion      Ankle eversion       (Blank rows = not tested) MMT for both hips was limited due to pain per pt's reports, L more so than R     LUMBAR SPECIAL TESTS:  Slump test: Negative SI Comp-positive/distraction-negative   FUNCTIONAL TESTS:  5 times sit to stand: 01/03/21: 68.42 sec (with UE assist)  6 minute walk test: 01/03/21: 748 feet (pain increased from 3/10 to 5/10)   GAIT: Distance walked: In clinic Assistive device utilized: None Level of assistance: Complete Independence Comments: Pt walks at a decrease pace             TRANSFERS: Sit stand decreased pain with report of pain               Eval for sternal pain 01/05/21               ROM: Shoulder ROMs were found to be symmetrical and WNLs. ER to T6 bilat. IR to T10 bilat.               Strength: Testing was modified limiting resistance related to pt's report of sternal discomfort. B shoulders were found to be 4/5  [facility] Adult PT Treatment:                                                DATE: 01/30/21 Therapeutic Exercise: UBE 51mn, L1, 2.5 min each direction Seated lumbar flexion forward and laterally with physioball 10 sec x 5 Shoulder row, 2x10, 10# FM  Shoulder ext, 2x10, 10#, FM Side steps, con and ecc, each direction, 10#, FM Seated lumbar flexion forward and laterally with physioball 10 sec x 5, post lifting with pt reporting a min increase in LBP to 5/10  with tightness. Stretching was completed to address.  Therapeutic  Activity Waist to shoulder height lifts, 2x10, #15 Knee to waist height lifts, 2x10, 25#  Self Care: Proper technique for waist to shoulder and knee to waist lifting    [facility] Adult PT Treatment:                                                DATE: 01/25/21  Therapeutic Exercise: Nu-step 479mn, L7, UE/LE  Omega knee ext, 2x10, 25# Omega knee flex, 2x10, 25# Omega chest press, 2x10, 25#  Omega lat pull, 2x10, 25#  Cybex leg press, 2x10, 80# Seated lumbar flexion forward and laterally with physioball 10 sec x 5 Prone plank, x10, 5" Prone leg lifts 2x20 Supine 90/90 hips and shoulders, abdominal bracing, x10, 10 sec   [facility] Adult PT Treatment:                                                DATE: 01/23/21  Therapeutic Exercise: Nu-step 557m, L5, UE/LE    STS, 21" height, x15, 15#  Hip hinge lift c dowel, x10 Hip, hinge lifting c 10#, from 18'' Chest/serratus press, 2x15 GTB Seated lumbar flexion forward and laterally with physioball 10 sec x 5 Prone plank, x10, 5" shoulder flex 90d, 2x10, bliat 5#  Prone leg lifts x20 Prone alt arm/leg lifts x20 Cybex, hip abd, 3x10, 22.5#   [facility] Adult PT Treatment:                                                DATE: 01/18/21  Therapeutic Exercise: Nu-step 79m83m L5, UE/LE    Standing hip ext in min trunk flexion, alt, x20 Shoulder row, GTB, 2x15 Shoulder ext, GTB, 2x15 STS, 18" height, x10, less labored Hip hinge lift c dowel, x10 Hip, hinge lifting c 10#, from 18'' Chest/serratus press, 2x15 RTB Seated lumbar flexion forward and laterally with physioball 10 sec x 5 Prone plank, x10, 5" Waist to shoulder height lifts, x10, L and R with 3# and then 5#  Prone leg lifts x20 Prone alt arm/leg lifts x20  Self care: Use of tennis ball and theracane to address low back tightness     [facility] Adult PT Treatment:                                                DATE: 01/16/21 Therapeutic Exercise: Standing hip ext in min trunk flexion, alt,  x20 Shoulder row, GTB, 2x15 Shoulder ext, GTB, 2x15 STS, 20" height, x10, less labored Hip hinge lift c dowel, x10 Hip, hinge lifting c 10#, from 18'' Chest/serratus press, 2x15 RTB Seated lumbar flexion forward and laterally with physioball 10 sec x 5   Self care: Review of FOTO with pt making good progress c perceived function            PATIENT EDUCATION:  Education details: FOTO score and predictions Person educated: Patient Education method: Explanation, Demonstration, Tactile cues,  Verbal cues, and Handouts Education comprehension: verbalized understanding, returned demonstration, verbal cues required, and tactile cues required     HOME EXERCISE PROGRAM:             Access Code: [email or web address] URL: [email or web address] Date: 01/05/2021 Prepared by: Gar Ponto   Exercises Supine Bridge - 2 x daily - 7 x weekly - 1 sets - 10 reps - 3 hold Supine Posterior Pelvic Tilt - 2 x daily - 7 x weekly - 1 sets - 10 reps - 3 hold Hooklying Single Knee to Chest Stretch - 2 x daily - 7 x weekly - 1 sets - 3 reps - 15 hold Hooklying Clamshell with Resistance - 1 x daily - 7 x weekly - 1 sets - 10 reps - 3 hold Supine Hip Adduction Isometric with Ball - 1 x daily - 7 x weekly - 1 sets - 10 reps - 3 hold Standing Shoulder Row with Anchored Resistance - 1 x daily - 7 x weekly - 1 sets - 10 reps - 3 hold Shoulder Extension with Resistance - 1 x daily - 7 x weekly - 1 sets - 10 reps - 3 hold Shoulder External Rotation and Scapular Retraction with Resistance - 1 x daily - 7 x weekly - 1 sets - 10 reps - 3 hold Standing Serratus Punch with Resistance - 1 x daily - 7 x weekly - 1 sets - 10 reps - 3 hold Doorway Pec Stretch at 90 Degrees Abduction - 1 x daily - 7 x weekly - 1 sets - 3 reps - 20 hold     ASSESSMENT:   CLINICAL IMPRESSION:  Continued PT for UB, LB and trunk strengthening. Lifting was completed to simulate lifting at work with a progression with the amount of weight.  After lifting, pt reported a min increase c tightness of his low back. Therex and liftng were f/u with low back stretches to address tightness. A cold and/or hot pack was offered with pt declining, stating he would do so when he gets home. Bilat hip strength was reassessed and LTG was met.   REHAB POTENTIAL: Good   CLINICAL DECISION MAKING: Stable/uncomplicated   EVALUATION COMPLEXITY: Low     GOALS:     SHORT TERM GOALS:   STG Name Target Date Goal status  1 Pt will be ind in an initial HEP Baseline: started on eval 01/25/21 Met  2 Pt will voice understanding of measures to assist in the reduction of pain. Heat, cold, massage Baseline:  01/25/21 Met  3 Complete eval for sternal pain and set goals 01/05/22 Met    LONG TERM GOALS:    LTG Name Target Date Goal status  1 Improve pt's trunk ROM to minimal limitation for improved function Baseline: Moderate limitations 03/02/21 INITIAL  2 Improve bilat hip strength to 4+/5 or greater for improved function. 11/24: bilat hip strength= 4+/5 Baseline: See flowsheet 03/02/21 Met 01/30/21  3 Pt will report a decrease in LBP and sternal to 3/10 or less c daily and work related activity  Baseline:low back 0-6/10. Sternum 4-8/10 03/02/21 INITIAL  4 Pt will be ind in proper lifting techniques for return to work Baseline: 03/02/21 INITIAL  5 Increase FOTO perceived functional ability score to predicted value of 62% Baseline: 03/02/21 INITIAL  6 Determine physical demands and sets to help determine readiness for return to work. 01/09/21: Will be demonstrate proper technique for floor to waist lifting of 40# for 2x10 reps and of 20# from waist  to shoulder height 2x10 to demonstrate functional abilities for return to work. Baseline: 03/02/21 INITIAL  7  Pt will demostrate 5/5 strength for B UEs Baseline: 03/02/21  INITIAL     PLAN: PT FREQUENCY: 2x/week   PT DURATION: 8 weeks   PLANNED INTERVENTIONS: Therapeutic exercises, Therapeutic activity, Neuro  Muscular re-education, Balance training, Gait training, Patient/Family education, Joint mobilization, Dry Needling, Electrical stimulation, Spinal mobilization, Cryotherapy, Moist heat, Taping, Traction, Ultrasound, Ionotophoresis 32m/ml Dexamethasone, and Manual therapy   PLAN FOR NEXT SESSION: set goals for  5xSTS and 6MWT (see functional tests), progress therex demand including lifting as tolerated, progress HEP and lifting as indicated.     Shaneal Barasch MS, PT 01/30/21 4:30 PM

## 2021-02-01 ENCOUNTER — Other Ambulatory Visit: Payer: Self-pay

## 2021-02-01 ENCOUNTER — Ambulatory Visit: Payer: BC Managed Care – PPO

## 2021-02-01 DIAGNOSIS — M6281 Muscle weakness (generalized): Secondary | ICD-10-CM

## 2021-02-01 DIAGNOSIS — M5386 Other specified dorsopathies, lumbar region: Secondary | ICD-10-CM

## 2021-02-01 DIAGNOSIS — R0789 Other chest pain: Secondary | ICD-10-CM

## 2021-02-01 DIAGNOSIS — M545 Low back pain, unspecified: Secondary | ICD-10-CM | POA: Diagnosis not present

## 2021-02-01 NOTE — Therapy (Signed)
OUTPATIENT PHYSICAL THERAPY TREATMENT NOTE/Progress Note     Patient Name: Donald Cherry MRN: [id number] DOB:09-28-1975, 46 y.o., male Today's Date: 01/11/2021   PCP: Patient, No Pcp Per (Inactive) REFERRING PROVIDER: Ladell Pier, MD    See note below for Objective Data and Assessment of Progress/Goals.         PT End of Session - 01/30/21        Visit Number 11    Number of Visits 17     Date for PT Re-Evaluation 03/02/21     Authorization Type BCBS COMM PPO Reassess FOTO on 11th visits     PT Start Time 1104    PT Stop Time 1145    PT Time Calculation (min) 41    Activity Tolerance Patient tolerated treatment well     Behavior During Therapy [facility] for tasks assessed                      History reviewed. No pertinent past medical history. History reviewed. No pertinent surgical history.     Patient Active Problem List    Diagnosis Date Noted   Acute bilateral low back pain without sciatica 12/16/2020   Essential hypertension 12/16/2020   Gastroesophageal reflux disease without esophagitis 12/04/2020   Aortic atherosclerosis ([place]) 12/04/2020   Elevated blood pressure reading in office without diagnosis of hypertension 12/04/2020   History of tobacco abuse 12/04/2020   Pulmonary emphysema ([place]) 12/04/2020   BMI 20.0-20.9, adult 12/04/2020    REFERRING PROVIDER: Ladell Pier, MD    REFERRING DIAG: Chest pain, mid sternal. Acute bilateral low back pain without sciatic    ONSET DATE: 11/26/20   THERAPY DIAG:  Acute bilateral low back pain without sciatica   Chest pain, mid sternal   Muscle weakness (generalized)   PERTINENT HISTORY: PERTINENT HISTORY:  NA per SnapShot and pt report   PRECAUTIONS: PRECAUTIONS: Sternal- Pt reports being told to limit activity, complete as tolerated   WEIGHT BEARING RESTRICTIONS No   SUBJECTIVE: ptreports his low back was a little more sore after the lifting   PAIN:  Are you having pain? VAS scale: 3/10  for low back L>R, pain range for his low back is 1-4/10. 0/10 for strenum and chest (only pain if he lies in his stomach) Pain location: lower back and chest Pain orientation: Lower back, sternum PAIN TYPE: constant Pain description: sore  Aggravating factors: bending over, getting up and lifting Relieving factors: lay down, heating pad       OBJECTIVE:    DIAGNOSTIC FINDINGS:  IMPRESSION: 1. Osteopenia and degenerative change without evidence of acute fracture. 2. 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   IMPRESSION: 1. Subtle nondisplaced fracture of the proximal sternum, approximately 3 cm inferior to the sternomanubrial junction on lateral projection.     PATIENT SURVEYS:  FOTO 37% perceived functional abiltiy, 01/16/21=54%   SCREENING FOR RED FLAGS: Bowel or bladder incontinence: No   COGNITION:          Overall cognitive status: Within functional limits for tasks assessed                        SENSATION:          Light touch: Appears intact   MUSCLE LENGTH: Hamstrings: Right TBA deg; Left TBA deg   POSTURE:  Forward head, rounded shoulders, decreased thoracic,  decreased lordosis   PALPATION: TTP for the lumbar paraspinals   LUMBARAROM/PROM   A/PROM A/PROM  12/28/2020  Flexion Mod limited, hands to knees, provoked LBP  Extension Mod limited, most significantly provoked L LBP and LE pain    Right lateral flexion Mod limited, provoked L LBP and LE pain  Left lateral flexion Mod limited, provoked L LBP and LE pain  Right rotation Min limited, provoked R LBP and LE pain  Left rotation Min limited, provoked L LBP and LE pain   (Blank rows = not tested)   LE AROM/PROM:   A/PROM Right 12/28/2020 Left 12/28/2020  Hip flexion      Hip extension      Hip abduction      Hip adduction      Hip internal rotation      Hip external rotation      Knee flexion      Knee  extension      Ankle dorsiflexion      Ankle plantarflexion      Ankle inversion      Ankle eversion       (Blank rows = not tested)   LE MMT:   MMT Right 12/28/2020 Left 12/28/2020  Hip flexion 4+ 4  Hip extension 4+ 4  Hip abduction 4+ 4  Hip adduction 5 5  Hip internal rotation 5 4+  Hip external rotation 4+ 4  Knee flexion 5 5  Knee extension 5 5  Ankle dorsiflexion 5 5  Ankle plantarflexion 5 5  Ankle inversion      Ankle eversion       (Blank rows = not tested) MMT for both hips was limited due to pain per pt's reports, L more so than R     LUMBAR SPECIAL TESTS:  Slump test: Negative SI Comp-positive/distraction-negative   FUNCTIONAL TESTS:  5 times sit to stand: 01/03/21: 68.42 sec (with UE assist)  6 minute walk test: 01/03/21: 748 feet (pain increased from 3/10 to 5/10)   GAIT: Distance walked: In clinic Assistive device utilized: None Level of assistance: Complete Independence Comments: Pt walks at a decrease pace             TRANSFERS: Sit stand decreased pain with report of pain               Eval for sternal pain 01/05/21               ROM: Shoulder ROMs were found to be symmetrical and WNLs. ER to T6 bilat. IR to T10 bilat.               Strength: Testing was modified limiting resistance related to pt's report of sternal discomfort. B shoulders were found to be 4/5  [facility] Adult PT Treatment:                                                DATE: 02/01/21 Therapeutic Exercise: Elliptical 41mn, L1, R1 2.5 min each direction Seated lumbar flexion forward and laterally with physioball 10 sec x 5 Abdominal bracing legs 90/90 and arms 90 x5, 10" Dead bug arms/legs c theraball, 2x10 Prone alt arms/legs 2x10 Quad, alt legs x10 Quad, alt arms/legs x10 Arm curls 2x15, BTB  Therapeutic Activity: Knee to waist height lifts, 2x10, 25#  Self Care: Proper technique for hinged hip lifting and  abdominal activation. Pt returned demonstration.   [facility] Adult  PT Treatment:                                                DATE: 01/30/21 Therapeutic Exercise: UBE 34mn, L1, 2.5 min each direction Seated lumbar flexion forward and laterally with physioball 10 sec x 5 Shoulder row, 2x10, 10# FM  Shoulder ext, 2x10, 10#, FM Side steps, con and ecc, each direction, 10#, FM Seated lumbar flexion forward and laterally with physioball 10 sec x 5, post lifting with pt reporting a min increase in LBP to 5/10 with tightness. Stretching was completed to address.  Therapeutic Activity Waist to shoulder height lifts, 2x10, #15 Knee to waist height lifts, 2x10, 25#  Self Care: Proper technique for waist to shoulder and knee to waist lifting    [facility] Adult PT Treatment:                                                DATE: 01/25/21  Therapeutic Exercise: Nu-step 518m, L7, UE/LE  Omega knee ext, 2x10, 25# Omega knee flex, 2x10, 25# Omega chest press, 2x10, 25#  Omega lat pull, 2x10, 25#  Cybex leg press, 2x10, 80# Seated lumbar flexion forward and laterally with physioball 10 sec x 5 Prone plank, x10, 5" Prone leg lifts 2x20 Supine 90/90 hips and shoulders, abdominal bracing, x10, 10 sec   [facility] Adult PT Treatment:                                                DATE: 01/23/21  Therapeutic Exercise: Nu-step 52m10m L5, UE/LE    STS, 21" height, x15, 15#  Hip hinge lift c dowel, x10 Hip, hinge lifting c 10#, from 18'' Chest/serratus press, 2x15 GTB Seated lumbar flexion forward and laterally with physioball 10 sec x 5 Prone plank, x10, 5" shoulder flex 90d, 2x10, bliat 5#  Prone leg lifts x20 Prone alt arm/leg lifts x20 Cybex, hip abd, 3x10, 22.5#   [facility] Adult PT Treatment:                                                DATE: 01/18/21  Therapeutic Exercise: Nu-step 52mi48mL5, UE/LE    Standing hip ext in min trunk flexion, alt, x20 Shoulder row, GTB, 2x15 Shoulder ext, GTB, 2x15 STS, 18" height, x10, less labored Hip hinge lift c dowel, x10 Hip,  hinge lifting c 10#, from 18'' Chest/serratus press, 2x15 RTB Seated lumbar flexion forward and laterally with physioball 10 sec x 5 Prone plank, x10, 5" Waist to shoulder height lifts, x10, L and R with 3# and then 5#  Prone leg lifts x20 Prone alt arm/leg lifts x20  Self care: Use of tennis ball and theracane to address low back tightness       PATIENT EDUCATION:  Education details: FOTO score and predictions Person educated: Patient Education method: Explanation, Demonstration, Tactile cues, Verbal cues, and Handouts Education comprehension:  verbalized understanding, returned demonstration, verbal cues required, and tactile cues required     HOME EXERCISE PROGRAM:             Access Code: [email or web address] URL: [email or web address] Date: 01/05/2021 Prepared by: Gar Ponto   Exercises Supine Bridge - 2 x daily - 7 x weekly - 1 sets - 10 reps - 3 hold Supine Posterior Pelvic Tilt - 2 x daily - 7 x weekly - 1 sets - 10 reps - 3 hold Hooklying Single Knee to Chest Stretch - 2 x daily - 7 x weekly - 1 sets - 3 reps - 15 hold Hooklying Clamshell with Resistance - 1 x daily - 7 x weekly - 1 sets - 10 reps - 3 hold Supine Hip Adduction Isometric with Ball - 1 x daily - 7 x weekly - 1 sets - 10 reps - 3 hold Standing Shoulder Row with Anchored Resistance - 1 x daily - 7 x weekly - 1 sets - 10 reps - 3 hold Shoulder Extension with Resistance - 1 x daily - 7 x weekly - 1 sets - 10 reps - 3 hold Shoulder External Rotation and Scapular Retraction with Resistance - 1 x daily - 7 x weekly - 1 sets - 10 reps - 3 hold Standing Serratus Punch with Resistance - 1 x daily - 7 x weekly - 1 sets - 10 reps - 3 hold Doorway Pec Stretch at 90 Degrees Abduction - 1 x daily - 7 x weekly - 1 sets - 3 reps - 20 hold     ASSESSMENT:   CLINICAL IMPRESSION: PT continues to make appropriate progress with strengthening and lifting demand in preparation ofr RTW. Pt is completing lifting with proper  technique c abdominal activation. I addition to lifting, therex was completed for core strengthening for lumbar stability. Pt tolerated the session without adverse effects. Pt will continue to benefit from skilled PT to optimize function with minimized low back and sternal pain.    REHAB POTENTIAL: Good   CLINICAL DECISION MAKING: Stable/uncomplicated   EVALUATION COMPLEXITY: Low     GOALS:     SHORT TERM GOALS:   STG Name Target Date Goal status  1 Pt will be ind in an initial HEP Baseline: started on eval 01/25/21 Met  2 Pt will voice understanding of measures to assist in the reduction of pain. Heat, cold, massage Baseline:  01/25/21 Met  3 Complete eval for sternal pain and set goals 01/05/22 Met    LONG TERM GOALS:    LTG Name Target Date Goal status  1 Improve pt's trunk ROM to minimal limitation for improved function Baseline: Moderate limitations 03/02/21 INITIAL  2 Improve bilat hip strength to 4+/5 or greater for improved function. 11/24: bilat hip strength= 4+/5 Baseline: See flowsheet 03/02/21 Met 01/30/21  3 Pt will report a decrease in LBP and sternal to 3/10 or less c daily and work related activity  Baseline:low back 0-6/10. Sternum 4-8/10 03/02/21 INITIAL  4 Pt will be ind in proper lifting techniques for return to work Baseline: 03/02/21 INITIAL  5 Increase FOTO perceived functional ability score to predicted value of 62% Baseline: 03/02/21 INITIAL  6 Determine physical demands and sets to help determine readiness for return to work. 01/09/21: Will be demonstrate proper technique for floor to waist lifting of 40# for 2x10 reps and of 20# from waist to shoulder height 2x10 to demonstrate functional abilities for return to work. Baseline: 03/02/21 INITIAL  7  Pt will  demostrate 5/5 strength for B UEs Baseline: 03/02/21  INITIAL   8 Improve 5xSTS to 15 sec or less c or s use of arms Baseline: 68.4 sec 03/02/21 INITIAL  9 Improve pt's 6MWT to 1079f  Baseline: 7485f2/24/23  INITIAL    PLAN: PT FREQUENCY: 2x/week   PT DURATION: 8 weeks   PLANNED INTERVENTIONS: Therapeutic exercises, Therapeutic activity, Neuro Muscular re-education, Balance training, Gait training, Patient/Family education, Joint mobilization, Dry Needling, Electrical stimulation, Spinal mobilization, Cryotherapy, Moist heat, Taping, Traction, Ultrasound, Ionotophoresis 37m58ml Dexamethasone, and Manual therapy   PLAN FOR NEXT SESSION: Progress therex demand including lifting as tolerated, progress HEP     Danika Kluender MS, PT 02/01/21 12:38 PM

## 2021-02-06 ENCOUNTER — Ambulatory Visit: Payer: BC Managed Care – PPO

## 2021-02-06 ENCOUNTER — Other Ambulatory Visit: Payer: Self-pay

## 2021-02-06 DIAGNOSIS — M6281 Muscle weakness (generalized): Secondary | ICD-10-CM

## 2021-02-06 DIAGNOSIS — M545 Low back pain, unspecified: Secondary | ICD-10-CM

## 2021-02-06 DIAGNOSIS — R0789 Other chest pain: Secondary | ICD-10-CM

## 2021-02-06 DIAGNOSIS — M5386 Other specified dorsopathies, lumbar region: Secondary | ICD-10-CM

## 2021-02-06 NOTE — Therapy (Signed)
OUTPATIENT PHYSICAL THERAPY TREATMENT NOTE     Patient Name: Donald Cherry MRN: [id number] DOB:[birth year], 46 y.o., male Today's Date: 01/11/2021   PCP: Patient, No Pcp Per (Inactive) REFERRING PROVIDER: Ladell Pier, MD    See note below for Objective Data and Assessment of Progress/Goals.         PT End of Session - 01/30/21        Visit Number 12    Number of Visits 17     Date for PT Re-Evaluation 03/02/21     Authorization Type BCBS COMM PPO Reassess FOTO on 11th visits     PT Start Time 1100    PT Stop Time 1143    PT Time Calculation (min) 43    Activity Tolerance Patient tolerated treatment well     Behavior During Therapy [facility] for tasks assessed                      History reviewed. No pertinent past medical history. History reviewed. No pertinent surgical history.     Patient Active Problem List    Diagnosis Date Noted   Acute bilateral low back pain without sciatica 12/16/2020   Essential hypertension 12/16/2020   Gastroesophageal reflux disease without esophagitis 12/04/2020   Aortic atherosclerosis ([place]) 12/04/2020   Elevated blood pressure reading in office without diagnosis of hypertension 12/04/2020   History of tobacco abuse 12/04/2020   Pulmonary emphysema ([place]) 12/04/2020   BMI 20.0-20.9, adult 12/04/2020    REFERRING PROVIDER: Ladell Pier, MD    REFERRING DIAG: Chest pain, mid sternal. Acute bilateral low back pain without sciatic    ONSET DATE: 11/26/20   THERAPY DIAG:  Acute bilateral low back pain without sciatica   Chest pain, mid sternal   Muscle weakness (generalized)   PERTINENT HISTORY: PERTINENT HISTORY:  NA per SnapShot and pt report   PRECAUTIONS: PRECAUTIONS: Sternal- Pt reports being told to limit activity, complete as tolerated   WEIGHT BEARING RESTRICTIONS No   SUBJECTIVE: Pt reports his low back [ain has continued to improve.   PAIN:  Are you having pain? VAS scale: 1-2/10 for low back L>R, pain  range for his low back is 0-3/10. 0/10 for strenum and chest (only pain if he lies in his stomach) Pain location: lower back and chest Pain orientation: Lower back, sternum PAIN TYPE: constant Pain description: sore  Aggravating factors: bending over, getting up and lifting Relieving factors: lay down, heating pad       OBJECTIVE:    DIAGNOSTIC FINDINGS:  IMPRESSION: 1. Osteopenia and degenerative change without evidence of acute fracture. 2. 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   IMPRESSION: 1. Subtle nondisplaced fracture of the proximal sternum, approximately 3 cm inferior to the sternomanubrial junction on lateral projection.     PATIENT SURVEYS:  FOTO 37% perceived functional abiltiy, 01/16/21=54%   SCREENING FOR RED FLAGS: Bowel or bladder incontinence: No   COGNITION:          Overall cognitive status: Within functional limits for tasks assessed                        SENSATION:          Light touch: Appears intact   MUSCLE LENGTH: Hamstrings: Right TBA deg; Left TBA deg   POSTURE:  Forward head, rounded shoulders, decreased thoracic, decreased lordosis  PALPATION: TTP for the lumbar paraspinals   LUMBARAROM/PROM   A/PROM A/PROM  12/28/2020  Flexion Mod limited, hands to knees, provoked LBP  Extension Mod limited, most significantly provoked L LBP and LE pain    Right lateral flexion Mod limited, provoked L LBP and LE pain  Left lateral flexion Mod limited, provoked L LBP and LE pain  Right rotation Min limited, provoked R LBP and LE pain  Left rotation Min limited, provoked L LBP and LE pain   (Blank rows = not tested)   LE AROM/PROM:   A/PROM Right 12/28/2020 Left 12/28/2020  Hip flexion      Hip extension      Hip abduction      Hip adduction      Hip internal rotation      Hip external rotation      Knee flexion      Knee extension      Ankle  dorsiflexion      Ankle plantarflexion      Ankle inversion      Ankle eversion       (Blank rows = not tested)   LE MMT:   MMT Right 12/28/2020 Left 12/28/2020  Hip flexion 4+ 4  Hip extension 4+ 4  Hip abduction 4+ 4  Hip adduction 5 5  Hip internal rotation 5 4+  Hip external rotation 4+ 4  Knee flexion 5 5  Knee extension 5 5  Ankle dorsiflexion 5 5  Ankle plantarflexion 5 5  Ankle inversion      Ankle eversion       (Blank rows = not tested) MMT for both hips was limited due to pain per pt's reports, L more so than R     LUMBAR SPECIAL TESTS:  Slump test: Negative SI Comp-positive/distraction-negative   FUNCTIONAL TESTS:  5 times sit to stand: 01/03/21: 68.42 sec (with UE assist)  6 minute walk test: 01/03/21: 748 feet (pain increased from 3/10 to 5/10)   GAIT: Distance walked: In clinic Assistive device utilized: None Level of assistance: Complete Independence Comments: Pt walks at a decrease pace             TRANSFERS: Sit stand decreased pain with report of pain               Eval for sternal pain 01/05/21               ROM: Shoulder ROMs were found to be symmetrical and WNLs. ER to T6 bilat. IR to T10 bilat.               Strength: Testing was modified limiting resistance related to pt's report of sternal discomfort. B shoulders were found to be 4/5   [facility] Adult PT Treatment:                                                DATE: 02/06/21 Therapeutic Exercise: Nu-step 5 min, L& Seated lumbar flexion forward and laterally with physioball 10 sec x 5 Abdominal bracing legs 90/90 and arms 90 x5, 10" Dead bug arms/legs c theraball, 2x10 Quad, alt arms/legs 2x10 Banded side steps, BTB, 4x, 60f Cybex leg press, 2x10, 100#  Therapeutic Activity: Waist to shoulder height lifts, 2x10, #20 Knee to waist height lifts, 2x10, 30# Self Care: Verbal reminder re: proper lifting technique. Pt returned with  proper demonstration of technique    [facility] Adult PT  Treatment:                                                DATE: 02/01/21 Therapeutic Exercise: Elliptical 42mn, L1, R1 2.5 min each direction Seated lumbar flexion forward and laterally with physioball 10 sec x 5 Abdominal bracing legs 90/90 and arms 90 x5, 10" Dead bug arms/legs c theraball, 2x10 Prone alt arms/legs 2x10 Quad, alt legs x10 Quad, alt arms/legs x10 Arm curls 2x15, BTB  Therapeutic Activity: Knee to waist height lifts, 2x10, 25#  Self Care: Proper technique for hinged hip lifting and abdominal activation. Pt returned demonstration.   [facility]                                                DATE: 01/30/21 Therapeutic Exercise: UBE 510m, L1, 2.5 min each direction Seated lumbar flexion forward and laterally with physioball 10 sec x 5 Shoulder row, 2x10, 10# FM  Shoulder ext, 2x10, 10#, FM Side steps, con and ecc, each direction, 10#, FM Seated lumbar flexion forward and laterally with physioball 10 sec x 5, post lifting with pt reporting a min increase in LBP to 5/10 with tightness. Stretching was completed to address.  Therapeutic Activity Waist to shoulder height lifts, 2x10, #15 Knee to waist height lifts, 2x10, 25#  Self Care: Proper technique for waist to shoulder and knee to waist lifting    [facility] Adult PT Treatment:                                                DATE: 01/25/21  Therapeutic Exercise: Nu-step 13m713m L7, UE/LE  Omega knee ext, 2x10, 25# Omega knee flex, 2x10, 25# Omega chest press, 2x10, 25#  Omega lat pull, 2x10, 25#  Cybex leg press, 2x10, 80# Seated lumbar flexion forward and laterally with physioball 10 sec x 5 Prone plank, x10, 5" Prone leg lifts 2x20 Supine 90/90 hips and shoulders, abdominal bracing, x10, 10 sec   [facility] Adult PT Treatment:                                                DATE: 01/23/21  Therapeutic Exercise: Nu-step 13mi51mL5, UE/LE    STS, 21" height, x15, 15#  Hip hinge lift c dowel, x10 Hip,  hinge lifting c 10#, from 18'' Chest/serratus press, 2x15 GTB Seated lumbar flexion forward and laterally with physioball 10 sec x 5 Prone plank, x10, 5" shoulder flex 90d, 2x10, bliat 5#  Prone leg lifts x20 Prone alt arm/leg lifts x20 Cybex, hip abd, 3x10, 22.5#      PATIENT EDUCATION:  Education details: FOTO score and predictions Person educated: Patient Education method: Explanation, Demonstration, Tactile cues, Verbal cues, and Handouts Education comprehension: verbalized understanding, returned demonstration, verbal cues required, and tactile cues required     HOME EXERCISE PROGRAM:  Access Code: [email or web address] URL: [email or web address] Date: 01/05/2021 Prepared by: Gar Ponto   Exercises Supine Bridge - 2 x daily - 7 x weekly - 1 sets - 10 reps - 3 hold Supine Posterior Pelvic Tilt - 2 x daily - 7 x weekly - 1 sets - 10 reps - 3 hold Hooklying Single Knee to Chest Stretch - 2 x daily - 7 x weekly - 1 sets - 3 reps - 15 hold Hooklying Clamshell with Resistance - 1 x daily - 7 x weekly - 1 sets - 10 reps - 3 hold Supine Hip Adduction Isometric with Ball - 1 x daily - 7 x weekly - 1 sets - 10 reps - 3 hold Standing Shoulder Row with Anchored Resistance - 1 x daily - 7 x weekly - 1 sets - 10 reps - 3 hold Shoulder Extension with Resistance - 1 x daily - 7 x weekly - 1 sets - 10 reps - 3 hold Shoulder External Rotation and Scapular Retraction with Resistance - 1 x daily - 7 x weekly - 1 sets - 10 reps - 3 hold Standing Serratus Punch with Resistance - 1 x daily - 7 x weekly - 1 sets - 10 reps - 3 hold Doorway Pec Stretch at 90 Degrees Abduction - 1 x daily - 7 x weekly - 1 sets - 3 reps - 20 hold     ASSESSMENT:   CLINICAL IMPRESSION: PT was completed for trunk/core flexibility and strengthening, and lifting for readiness for RTW. Pt tolerated progressions for core strengthening, as well as lifting greater lifting demand. With progressed physical  demand, pt reports continued improvement in low back pain. Pt reports consistent completion of his HEP. Pt tolerated today's PT session without adverse effects.   REHAB POTENTIAL: Good   CLINICAL DECISION MAKING: Stable/uncomplicated   EVALUATION COMPLEXITY: Low     GOALS:     SHORT TERM GOALS:   STG Name Target Date Goal status  1 Pt will be ind in an initial HEP Baseline: started on eval 01/25/21 Met  2 Pt will voice understanding of measures to assist in the reduction of pain. Heat, cold, massage Baseline:  01/25/21 Met  3 Complete eval for sternal pain and set goals 01/05/22 Met    LONG TERM GOALS:    LTG Name Target Date Goal status  1 Improve pt's trunk ROM to minimal limitation for improved function Baseline: Moderate limitations 03/02/21 INITIAL  2 Improve bilat hip strength to 4+/5 or greater for improved function. 11/24: bilat hip strength= 4+/5 Baseline: See flowsheet 03/02/21 Met 01/30/21  3 Pt will report a decrease in LBP and sternal to 3/10 or less c daily and work related activity  Baseline:low back 0-6/10. Sternum 4-8/10 03/02/21 INITIAL  4 Pt will be ind in proper lifting techniques for return to work Baseline: 03/02/21 INITIAL  5 Increase FOTO perceived functional ability score to predicted value of 62% Baseline: 03/02/21 INITIAL  6 Determine physical demands and sets to help determine readiness for return to work. 01/09/21: Will be demonstrate proper technique for floor to waist lifting of 40# for 2x10 reps and of 20# from waist to shoulder height 2x10 to demonstrate functional abilities for return to work. Baseline: 03/02/21 INITIAL  7  Pt will demostrate 5/5 strength for B UEs Baseline: 03/02/21  INITIAL   8 Improve 5xSTS to 15 sec or less c or s use of arms Baseline: 68.4 sec 03/02/21 INITIAL  9 Improve pt's 6MWT to 106f  Baseline:  737f 03/02/21 INITIAL    PLAN: PT FREQUENCY: 2x/week   PT DURATION: 8 weeks   PLANNED INTERVENTIONS: Therapeutic exercises,  Therapeutic activity, Neuro Muscular re-education, Balance training, Gait training, Patient/Family education, Joint mobilization, Dry Needling, Electrical stimulation, Spinal mobilization, Cryotherapy, Moist heat, Taping, Traction, Ultrasound, Ionotophoresis 446mml Dexamethasone, and Manual therapy   PLAN FOR NEXT SESSION: Progress therex demand including lifting as tolerated, progress HEP, reassess FOTO and LTGs   Sybella Harnish MS, PT 02/06/21 1:15 PM

## 2021-02-07 NOTE — Therapy (Addendum)
OUTPATIENT PHYSICAL THERAPY TREATMENT NOTE °  °  °Patient Name: Donald Cherry °MRN: [id number] °DOB:09/17/1975, 46 y.o., male °Today's Date: 01/11/2021 °  °PCP: Patient, No Pcp Per (Inactive) °REFERRING PROVIDER: Johnson, Deborah B, MD ° ° ° °See note below for Objective Data and Assessment of Progress/Goals.  ° °  °  °  PT End of Session - 02/08/21   °  °  Visit Number 13  °  Number of Visits 17   °  Date for PT Re-Evaluation 03/02/21   °  Authorization Type BCBS COMM PPO Reassess FOTO on 11th visits   °  PT Start Time 1104  °  PT Stop Time 1146  °  PT Time Calculation (min) 42  °  Activity Tolerance Patient tolerated treatment well   °  Behavior During Therapy WFL for tasks assessed  °  °   °  °  °   °  °  °  °  °History reviewed. No pertinent past medical history. °History reviewed. No pertinent surgical history. °    °Patient Active Problem List  °  Diagnosis Date Noted  ° Acute bilateral low back pain without sciatica 12/16/2020  ° Essential hypertension 12/16/2020  ° Gastroesophageal reflux disease without esophagitis 12/04/2020  ° Aortic atherosclerosis ([facility]) 12/04/2020  ° Elevated blood pressure reading in office without diagnosis of hypertension 12/04/2020  ° History of tobacco abuse 12/04/2020  ° Pulmonary emphysema ([facility]) 12/04/2020  ° BMI 20.0-20.9, adult 12/04/2020  °  °REFERRING PROVIDER: Johnson, Deborah B, MD  °  °REFERRING DIAG: Chest pain, mid sternal. Acute bilateral low back pain without sciatic °   °ONSET DATE: 11/26/20 °  °THERAPY DIAG:  °Acute bilateral low back pain without sciatica °  °Chest pain, mid sternal °  °Muscle weakness (generalized) °  °PERTINENT HISTORY: PERTINENT HISTORY:  °NA per SnapShot and pt report °  °PRECAUTIONS: PRECAUTIONS: Sternal- Pt reports being told to limit activity, complete as tolerated °  °WEIGHT BEARING RESTRICTIONS No °  °SUBJECTIVE: pt reports an increase in chest soreness after the last session and it it still a little sore. °  °PAIN:  °Are you having  pain? °VAS scale: 1-2/10 for low back L>R, pain range for his low back is 0-3/10. 1/10 for strenum and chest (only pain if he lies in his stomach) °Pain location: lower back and chest °Pain orientation: Lower back, sternum °PAIN TYPE: constant °Pain description: sore  °Aggravating factors: bending over, getting up and lifting °Relieving factors: lay down, heating pad °  °  °  °OBJECTIVE:  °  °DIAGNOSTIC FINDINGS:  °IMPRESSION: °1. Osteopenia and degenerative change without evidence of acute °fracture. °2. 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 °  °IMPRESSION: °1. Subtle nondisplaced fracture of the proximal sternum, °approximately 3 cm inferior to the sternomanubrial junction on °lateral projection. °  °  °PATIENT SURVEYS:  °FOTO 37% perceived functional abiltiy, 01/16/21=54% °  °SCREENING FOR RED FLAGS: °Bowel or bladder incontinence: No °  °COGNITION: °         Overall cognitive status: Within functional limits for tasks assessed              °          °SENSATION: °         Light touch: Appears intact °  °MUSCLE LENGTH: °Hamstrings: Right TBA deg; Left TBA deg °  °POSTURE:  °Forward head,   rounded shoulders, decreased thoracic, decreased lordosis °  °PALPATION: °TTP for the lumbar paraspinals °  °LUMBARAROM/PROM °  °A/PROM A/PROM  °12/28/2020  °Flexion Mod limited, hands to knees, provoked LBP  °Extension Mod limited, most significantly provoked L LBP and LE pain    °Right lateral flexion Mod limited, provoked L LBP and LE pain  °Left lateral flexion Mod limited, provoked L LBP and LE pain  °Right rotation Min limited, provoked R LBP and LE pain  °Left rotation Min limited, provoked L LBP and LE pain  ° (Blank rows = not tested) °  °LE AROM/PROM: °  °A/PROM Right °12/28/2020 Left °12/28/2020  °Hip flexion      °Hip extension      °Hip abduction      °Hip adduction      °Hip internal rotation      °Hip external rotation      °Knee  flexion      °Knee extension      °Ankle dorsiflexion      °Ankle plantarflexion      °Ankle inversion      °Ankle eversion      ° (Blank rows = not tested) °  °LE MMT: °  °MMT Right °12/28/2020 Left °12/28/2020  °Hip flexion 4+ 4  °Hip extension 4+ 4  °Hip abduction 4+ 4  °Hip adduction 5 5  °Hip internal rotation 5 4+  °Hip external rotation 4+ 4  °Knee flexion 5 5  °Knee extension 5 5  °Ankle dorsiflexion 5 5  °Ankle plantarflexion 5 5  °Ankle inversion      °Ankle eversion      ° (Blank rows = not tested) °MMT for both hips was limited due to pain per pt's reports, L more so than R °  °  °LUMBAR SPECIAL TESTS:  °Slump test: Negative °SI Comp-positive/distraction-negative °  °FUNCTIONAL TESTS:  °5 times sit to stand: 01/03/21: 68.42 sec (with UE assist)  °6 minute walk test: 01/03/21: 748 feet (pain increased from 3/10 to 5/10) °  °GAIT: °Distance walked: In clinic °Assistive device utilized: None °Level of assistance: Complete Independence °Comments: Pt walks at a decrease pace °  °          TRANSFERS: Sit stand decreased pain with report of pain °  °            Eval for sternal pain 01/05/21 °  °            ROM: Shoulder ROMs were found to be symmetrical and WNLs. ER to T6 bilat. IR to T10 bilat. °  °            Strength: Testing was modified limiting resistance related to pt's report of sternal discomfort. B shoulders were found to be 4/5 ° ° °[facility] Adult PT Treatment:                                                DATE: 02/08/21 °Therapeutic Exercise: °Nu-step 5 min, L8, UE/LE °Bridging c green theraball, 2x10, 5" °Seated lumbar flexion forward and laterally with physioball 10 sec x 5 °Dead bug arms/legs, 2x10 °Quad, alt arms/legs 2x10 °Cybex leg press, 3x10, 100# °Cybex hip abd, 3x10, 32# °Cybex hip ext,3x10, 32# ° ° °[facility] Adult PT Treatment:                                                  DATE: 02/06/21 °Therapeutic Exercise: °Nu-step 5 min, L& °Seated lumbar flexion forward and laterally with physioball 10 sec  x 5 °Abdominal bracing legs 90/90 and arms 90 x5, 10" °Dead bug arms/legs c theraball, 2x10 °Quad, alt arms/legs 2x10 °Banded side steps, BTB, 4x, 20ft °Cybex leg press, 2x10, 100# ° °Therapeutic Activity: °Waist to shoulder height lifts, 2x10, #20 °Knee to waist height lifts, 2x10, 30# °Self Care: °Verbal reminder re: proper lifting technique. Pt returned with proper demonstration of technique ° ° ° °[facility] Adult PT Treatment:                                                DATE: 02/01/21 °Therapeutic Exercise: °Elliptical 5min, L1, R1 2.5 min each direction °Seated lumbar flexion forward and laterally with physioball 10 sec x 5 °Abdominal bracing legs 90/90 and arms 90 x5, 10" °Dead bug arms/legs c theraball, 2x10 °Prone alt arms/legs 2x10 °Quad, alt legs x10 °Quad, alt arms/legs x10 °Arm curls 2x15, BTB ° °Therapeutic Activity: °Knee to waist height lifts, 2x10, 25# ° °Self Care: °Proper technique for hinged hip lifting and abdominal activation. Pt returned demonstration. ° ° °[facility] Adult PT Treatment:                                                DATE: 01/30/21 °Therapeutic Exercise: °UBE 5min, L1, 2.5 min each direction °Seated lumbar flexion forward and laterally with physioball 10 sec x 5 °Shoulder row, 2x10, 10# FM  °Shoulder ext, 2x10, 10#, FM °Side steps, con and ecc, each direction, 10#, FM °Seated lumbar flexion forward and laterally with physioball 10 sec x 5, post lifting with pt reporting a min increase in LBP to 5/10 with tightness. Stretching was completed to address. ° °Therapeutic Activity °Waist to shoulder height lifts, 2x10, #15 °Knee to waist height lifts, 2x10, 25# ° °Self Care: °Proper technique for waist to shoulder and knee to waist lifting ° °  °PATIENT EDUCATION:  °Education details: FOTO score and predictions °Person educated: Patient °Education method: Explanation, Demonstration, Tactile cues, Verbal cues, and Handouts °Education comprehension: verbalized understanding, returned  demonstration, verbal cues required, and tactile cues required °  °  °HOME EXERCISE PROGRAM: °            Access Code: [email or web address] °URL: [email or web address] °Date: 01/05/2021 °Prepared by: Syndi Pua °  °Exercises °Supine Bridge - 2 x daily - 7 x weekly - 1 sets - 10 reps - 3 hold °Supine Posterior Pelvic Tilt - 2 x daily - 7 x weekly - 1 sets - 10 reps - 3 hold °Hooklying Single Knee to Chest Stretch - 2 x daily - 7 x weekly - 1 sets - 3 reps - 15 hold °Hooklying Clamshell with Resistance - 1 x daily - 7 x weekly - 1 sets - 10 reps - 3 hold °Supine Hip Adduction Isometric with Ball - 1 x daily - 7 x weekly - 1 sets - 10 reps - 3 hold °Standing Shoulder Row with Anchored Resistance - 1 x daily - 7 x weekly - 1 sets - 10 reps - 3 hold °Shoulder Extension with Resistance - 1 x daily - 7 x weekly - 1 sets - 10 reps - 3 hold °Shoulder External   Rotation and Scapular Retraction with Resistance - 1 x daily - 7 x weekly - 1 sets - 10 reps - 3 hold °Standing Serratus Punch with Resistance - 1 x daily - 7 x weekly - 1 sets - 10 reps - 3 hold °Doorway Pec Stretch at 90 Degrees Abduction - 1 x daily - 7 x weekly - 1 sets - 3 reps - 20 hold °  °  °ASSESSMENT: °  °CLINICAL IMPRESSION: °With increased sternal soreness, PT was completed today for core and LE strengthening to give another day of rest for the strernum. Pt tolerated continued progression of therex for strengthening. Pt is making appropriate progress toward RTW. Pt will continue to benefit from skilled PT to optimize his functional abilities.  °  °REHAB POTENTIAL: Good °  °CLINICAL DECISION MAKING: Stable/uncomplicated °  °EVALUATION COMPLEXITY: Low °  °  °GOALS: °  °  °SHORT TERM GOALS: °  °STG Name Target Date Goal status  °1 Pt will be ind in an initial HEP °Baseline: started on eval 01/25/21 Met  °2 Pt will voice understanding of measures to assist in the reduction of pain. Heat, cold, massage °Baseline:  01/25/21 Met  °3 Complete eval for sternal pain  and set goals 01/05/22 Met  °  °LONG TERM GOALS:  °  °LTG Name Target Date Goal status  °1 Improve pt's trunk ROM to minimal limitation for improved function °Baseline: Moderate limitations 03/02/21 INITIAL  °2 Improve bilat hip strength to 4+/5 or greater for improved function. 11/24: bilat hip strength= 4+/5 °Baseline: See flowsheet 03/02/21 Met °01/30/21  °3 Pt will report a decrease in LBP and sternal to 3/10 or less c daily and work related activity  °Baseline:low back 0-6/10. Sternum 4-8/10 03/02/21 INITIAL  °4 Pt will be ind in proper lifting techniques for return to work °Baseline: 03/02/21 INITIAL  °5 Increase FOTO perceived functional ability score to predicted value of 62% °Baseline: 03/02/21 INITIAL  °6 Determine physical demands and sets to help determine readiness for return to work. 01/09/21: Will be demonstrate proper technique for floor to waist lifting of 40# for 2x10 reps and of 20# from waist to shoulder height 2x10 to demonstrate functional abilities for return to work. °Baseline: 03/02/21 INITIAL  °7  Pt will demostrate 5/5 strength for B UEs °Baseline: 03/02/21  INITIAL   °8 Improve 5xSTS to 15 sec or less c or s use of arms °Baseline: 68.4 sec 03/02/21 INITIAL  °9 Improve pt's 6MWT to 1000ft  °Baseline: 748ft 03/02/21 INITIAL  °  °PLAN: °PT FREQUENCY: 2x/week °  °PT DURATION: 8 weeks °  °PLANNED INTERVENTIONS: Therapeutic exercises, Therapeutic activity, Neuro Muscular re-education, Balance training, Gait training, Patient/Family education, Joint mobilization, Dry Needling, Electrical stimulation, Spinal mobilization, Cryotherapy, Moist heat, Taping, Traction, Ultrasound, Ionotophoresis 4mg/ml Dexamethasone, and Manual therapy °  °PLAN FOR NEXT SESSION: Progress therex demand including lifting as tolerated, progress HEP, reassess FOTO and LTGs °  °Ryu Cerreta MS, PT °02/13/21 12:40 PM ° ° ° ° ° ° ° ° ° ° ° ° °

## 2021-02-08 ENCOUNTER — Ambulatory Visit: Payer: BC Managed Care – PPO | Attending: Internal Medicine

## 2021-02-08 ENCOUNTER — Other Ambulatory Visit: Payer: Self-pay

## 2021-02-08 ENCOUNTER — Telehealth (INDEPENDENT_AMBULATORY_CARE_PROVIDER_SITE_OTHER): Payer: Self-pay | Admitting: Internal Medicine

## 2021-02-08 DIAGNOSIS — R0789 Other chest pain: Secondary | ICD-10-CM | POA: Insufficient documentation

## 2021-02-08 DIAGNOSIS — M545 Low back pain, unspecified: Secondary | ICD-10-CM | POA: Diagnosis not present

## 2021-02-08 DIAGNOSIS — M6281 Muscle weakness (generalized): Secondary | ICD-10-CM | POA: Diagnosis present

## 2021-02-08 DIAGNOSIS — M5386 Other specified dorsopathies, lumbar region: Secondary | ICD-10-CM | POA: Diagnosis present

## 2021-02-08 NOTE — Telephone Encounter (Signed)
Copied from [place] [phone]. Topic: General - Other >> Feb 08, 2021 12:47 PM Yvette Rack wrote: Reason for CRM: Pt called for update on whether the ADA assessment paperwork was received. Pt requests that Jay'a give him a call back. Cb# [phone]

## 2021-02-12 NOTE — Telephone Encounter (Signed)
Returned pt call and made aware that I have received any paperwork for him. Made pt aware that if he has a copy of the paperwork to bring a copy to [place] just in case if we don't received the fax copy. Pt states he doesn't understands and he will bring the copy by tomorrow

## 2021-02-13 ENCOUNTER — Other Ambulatory Visit: Payer: Self-pay

## 2021-02-13 ENCOUNTER — Ambulatory Visit: Payer: BC Managed Care – PPO

## 2021-02-13 DIAGNOSIS — M545 Low back pain, unspecified: Secondary | ICD-10-CM | POA: Diagnosis not present

## 2021-02-13 DIAGNOSIS — M5386 Other specified dorsopathies, lumbar region: Secondary | ICD-10-CM

## 2021-02-13 DIAGNOSIS — M6281 Muscle weakness (generalized): Secondary | ICD-10-CM

## 2021-02-13 DIAGNOSIS — R0789 Other chest pain: Secondary | ICD-10-CM

## 2021-02-13 NOTE — Therapy (Addendum)
OUTPATIENT PHYSICAL THERAPY TREATMENT NOTE     Patient Name: Donald Cherry MRN: [id number] DOB:08/26/1975, 46 y.o., male Today's Date: 01/11/2021   PCP: Patient, No Pcp Per (Inactive) REFERRING PROVIDER: Ladell Pier, MD    See note below for Objective Data and Assessment of Progress/Goals.         PT End of Session - 02/13/21        Visit Number 14    Number of Visits 17     Date for PT Re-Evaluation 03/02/21     Authorization Type BCBS COMM PPO Reassess FOTO on 11th visits     PT Start Time 1105    PT Stop Time 1140    PT Time Calculation (min) 35    Activity Tolerance Patient tolerated treatment well     Behavior During Therapy [facility] for tasks assessed                      History reviewed. No pertinent past medical history. History reviewed. No pertinent surgical history.     Patient Active Problem List    Diagnosis Date Noted   Acute bilateral low back pain without sciatica 12/16/2020   Essential hypertension 12/16/2020   Gastroesophageal reflux disease without esophagitis 12/04/2020   Aortic atherosclerosis ([place]) 12/04/2020   Elevated blood pressure reading in office without diagnosis of hypertension 12/04/2020   History of tobacco abuse 12/04/2020   Pulmonary emphysema ([place]) 12/04/2020   BMI 20.0-20.9, adult 12/04/2020    REFERRING PROVIDER: Ladell Pier, MD    REFERRING DIAG: Chest pain, mid sternal. Acute bilateral low back pain without sciatic    ONSET DATE: 11/26/20   THERAPY DIAG:  Acute bilateral low back pain without sciatica   Chest pain, mid sternal   Muscle weakness (generalized)   PERTINENT HISTORY: PERTINENT HISTORY:  NA per SnapShot and pt report   PRECAUTIONS: PRECAUTIONS: Sternal- Pt reports being told to limit activity, complete as tolerated   WEIGHT BEARING RESTRICTIONS No   SUBJECTIVE: Pt continues to report he is progressing well with pain and function. Pt notes he has an appt with Dr. Wynetta Emery after PT today.    PAIN:  Are you having pain? VAS scale: 0-2/10 for low back L>R, pain range for his low back is 0-3/10. 1/10 for strenum and chest (only pain if he lies in his stomach) Pain location: lower back and chest Pain orientation: Lower back, sternum PAIN TYPE: Intermittent Pain description: sore  Aggravating factors: bending over, twisting. Sitting too long Relieving factors: lay down, heating/massage pad       OBJECTIVE:    DIAGNOSTIC FINDINGS:  IMPRESSION: 1. Osteopenia and degenerative change without evidence of acute fracture. 2. 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   IMPRESSION: 1. Subtle nondisplaced fracture of the proximal sternum, approximately 3 cm inferior to the sternomanubrial junction on lateral projection.     PATIENT SURVEYS:  FOTO 37% perceived functional abiltiy, 01/16/21=54%   SCREENING FOR RED FLAGS: Bowel or bladder incontinence: No   COGNITION:          Overall cognitive status: Within functional limits for tasks assessed                        SENSATION:          Light touch: Appears intact   MUSCLE LENGTH: Hamstrings: Right TBA deg; Left TBA  deg   POSTURE:  Forward head, rounded shoulders, decreased thoracic, decreased lordosis   PALPATION: TTP for the lumbar paraspinals   LUMBARAROM/PROM   A/PROM A/PROM  12/28/2020  Flexion Mod limited, hands to knees, provoked LBP  Extension Mod limited, most significantly provoked L LBP and LE pain    Right lateral flexion Mod limited, provoked L LBP and LE pain  Left lateral flexion Mod limited, provoked L LBP and LE pain  Right rotation Min limited, provoked R LBP and LE pain  Left rotation Min limited, provoked L LBP and LE pain   (Blank rows = not tested)   LE AROM/PROM:   A/PROM Right 12/28/2020 Left 12/28/2020  Hip flexion      Hip extension      Hip abduction      Hip adduction      Hip internal  rotation      Hip external rotation      Knee flexion      Knee extension      Ankle dorsiflexion      Ankle plantarflexion      Ankle inversion      Ankle eversion       (Blank rows = not tested)   LE MMT:   MMT Right 12/28/2020 Left 12/28/2020  Hip flexion 4+ 4  Hip extension 4+ 4  Hip abduction 4+ 4  Hip adduction 5 5  Hip internal rotation 5 4+  Hip external rotation 4+ 4  Knee flexion 5 5  Knee extension 5 5  Ankle dorsiflexion 5 5  Ankle plantarflexion 5 5  Ankle inversion      Ankle eversion       (Blank rows = not tested) MMT for both hips was limited due to pain per pt's reports, L more so than R     LUMBAR SPECIAL TESTS:  Slump test: Negative SI Comp-positive/distraction-negative   FUNCTIONAL TESTS:  5 times sit to stand: 01/03/21: 68.42 sec (with UE assist)  6 minute walk test: 01/03/21: 748 feet (pain increased from 3/10 to 5/10)   GAIT: Distance walked: In clinic Assistive device utilized: None Level of assistance: Complete Independence Comments: Pt walks at a decrease pace             TRANSFERS: Sit stand decreased pain with report of pain               Eval for sternal pain 01/05/21               ROM: Shoulder ROMs were found to be symmetrical and WNLs. ER to T6 bilat. IR to T10 bilat.               Strength: Testing was modified limiting resistance related to pt's report of sternal discomfort. B shoulders were found to be 4/5   [facility] Adult PT Treatment:                                                DATE: 02/13/21 Therapeutic Exercise: Seated lumbar flexion forward and laterally with physioball 10 sec x 5 Bridging c theraball, 2x15 Abdominal bracing legs 90/90 and arms 90 x5, 10" Dead bug arms/legs c theraball, 2x10 Quad, alt arms/legs 2x10 Cybex hip abd, 2x10, 32# Therapeutic Activity: Waist to shoulder height lifts, 3x10, #20, BUEs Knee to waist height lifts, 2x10, 45#, 90d turn  [facility] Adult PT Treatment:                                                 DATE: 02/08/21 Therapeutic Exercise: Nu-step 5 min, L8, UE/LE Bridging c green theraball, 2x10, 5" Seated lumbar flexion forward and laterally with physioball 10 sec x 5 Dead bug arms/legs, 2x10 Quad, alt arms/legs 2x10 Cybex leg press, 3x10, 100# Cybex hip abd, 3x10, 32# Cybex hip ext,3x10, 32#   [facility] Adult PT Treatment:                                                DATE: 02/06/21 Therapeutic Exercise: Nu-step 5 min, L7 Seated lumbar flexion forward and laterally with physioball 10 sec x 5 Abdominal bracing legs 90/90 and arms 90 x5, 10" Dead bug arms/legs c theraball, 2x10 Quad, alt arms/legs 2x10 Banded side steps, BTB, 4x, 26f Cybex leg press, 2x10, 100#  Therapeutic Activity: Waist to shoulder height lifts, 2x10, #20 Knee to waist height lifts, 2x10, 30# Self Care: Verbal reminder re: proper lifting technique. Pt returned with proper demonstration of technique    PATIENT EDUCATION:  Education details: FOTO score and predictions Person educated: Patient Education method: Explanation, Demonstration, Tactile cues, Verbal cues, and Handouts Education comprehension: verbalized understanding, returned demonstration, verbal cues required, and tactile cues required     HOME EXERCISE PROGRAM:             Access Code: [email or web address]: [email or web address] Date: 01/05/2021 Prepared by: AGar Ponto  Exercises Supine Bridge - 2 x daily - 7 x weekly - 1 sets - 10 reps - 3 hold Supine Posterior Pelvic Tilt - 2 x daily - 7 x weekly - 1 sets - 10 reps - 3 hold Hooklying Single Knee to Chest Stretch - 2 x daily - 7 x weekly - 1 sets - 3 reps - 15 hold Hooklying Clamshell with Resistance - 1 x daily - 7 x weekly - 1 sets - 10 reps - 3 hold Supine Hip Adduction Isometric with Ball - 1 x daily - 7 x weekly - 1 sets - 10 reps - 3 hold Standing Shoulder Row with Anchored Resistance - 1 x daily - 7 x weekly - 1 sets - 10 reps - 3 hold Shoulder Extension with  Resistance - 1 x daily - 7 x weekly - 1 sets - 10 reps - 3 hold Shoulder External Rotation and Scapular Retraction with Resistance - 1 x daily - 7 x weekly - 1 sets - 10 reps - 3 hold Standing Serratus Punch with Resistance - 1 x daily - 7 x weekly - 1 sets - 10 reps - 3 hold Doorway Pec Stretch at 90 Degrees Abduction - 1 x daily - 7 x weekly - 1 sets - 3 reps - 20 hold     ASSESSMENT:   CLINICAL IMPRESSION:  Pt needed to leave PT appt early due to a MD appt immediately following. PT was completed for lumbopelvic and LE flexibility and strengthening, and for lifting simulating work tasks. Knee to waist height lifting demand was increased. Pt reported tolerating, reporting only min chest soreness. Will continue to progress physical demand as tolerated for preparation for RTW.  REHAB POTENTIAL: Good   CLINICAL DECISION MAKING: Stable/uncomplicated   EVALUATION COMPLEXITY: Low     GOALS:     SHORT TERM GOALS:   STG Name Target Date Goal status  1 Pt will be ind in an initial HEP Baseline: started on eval 01/25/21 Met  2 Pt will voice understanding of measures to assist in the reduction of pain. Heat, cold, massage Baseline:  01/25/21 Met  3 Complete eval for sternal pain and set goals 01/05/22 Met    LONG TERM GOALS:    LTG Name Target Date Goal status  1 Improve pt's trunk ROM to minimal limitation for improved function Baseline: Moderate limitations 03/02/21 INITIAL  2 Improve bilat hip strength to 4+/5 or greater for improved function. 11/24: bilat hip strength= 4+/5 Baseline: See flowsheet 03/02/21 Met 01/30/21  3 Pt will report a decrease in LBP and sternal to 3/10 or less c daily and work related activity  Baseline:low back 0-6/10. Sternum 4-8/10 03/02/21 INITIAL  4 Pt will be ind in proper lifting techniques for return to work Baseline: 03/02/21 INITIAL  5 Increase FOTO perceived functional ability score to predicted value of 62% Baseline: 03/02/21 INITIAL  6 Determine  physical demands and sets to help determine readiness for return to work. 01/09/21: Will be demonstrate proper technique for floor to waist lifting of 40# for 2x10 reps and of 20# from waist to shoulder height 2x10 to demonstrate functional abilities for return to work. Baseline: 03/02/21 INITIAL  7  Pt will demostrate 5/5 strength for B UEs Baseline: 03/02/21  INITIAL   8 Improve 5xSTS to 15 sec or less c or s use of arms Baseline: 68.4 sec 03/02/21 INITIAL  9 Improve pt's 6MWT to 1041f  Baseline: 7468f2/24/23 INITIAL    PLAN: PT FREQUENCY: 2x/week   PT DURATION: 8 weeks   PLANNED INTERVENTIONS: Therapeutic exercises, Therapeutic activity, Neuro Muscular re-education, Balance training, Gait training, Patient/Family education, Joint mobilization, Dry Needling, Electrical stimulation, Spinal mobilization, Cryotherapy, Moist heat, Taping, Traction, Ultrasound, Ionotophoresis 79m73ml Dexamethasone, and Manual therapy   PLAN FOR NEXT SESSION: Progress therex demand including lifting as tolerated, progress HEP, reassess FOTO and [employer]TG[employer], PT 02/13/21 12:41 PM

## 2021-02-14 ENCOUNTER — Telehealth: Payer: Self-pay | Admitting: Internal Medicine

## 2021-02-14 NOTE — Telephone Encounter (Signed)
Phone call placed to patient today to inquire about the recent form that he dropped off.  It is an ADA accommodation form.  I asked the patient why are we having to fill this out given that I have filled out 2 forms for him already for the same company.  Patient states he was informed by his company that his short-term disability will expire on 02/19/2021 and that he needed to have this form completed even though on his last form I indicated that he will be out until 03/02/2021.

## 2021-02-15 ENCOUNTER — Ambulatory Visit: Payer: BC Managed Care – PPO

## 2021-02-15 ENCOUNTER — Other Ambulatory Visit: Payer: Self-pay

## 2021-02-15 DIAGNOSIS — M545 Low back pain, unspecified: Secondary | ICD-10-CM | POA: Diagnosis not present

## 2021-02-15 DIAGNOSIS — M5386 Other specified dorsopathies, lumbar region: Secondary | ICD-10-CM

## 2021-02-15 DIAGNOSIS — M6281 Muscle weakness (generalized): Secondary | ICD-10-CM

## 2021-02-15 DIAGNOSIS — R0789 Other chest pain: Secondary | ICD-10-CM

## 2021-02-15 NOTE — Telephone Encounter (Signed)
Contacted pt and made aware that form is ready for pick up and I have faxed them as well

## 2021-02-15 NOTE — Therapy (Signed)
OUTPATIENT PHYSICAL THERAPY TREATMENT NOTE     Patient Name: Donald Cherry MRN: [id number] DOB:December 11, 1975, 46 y.o., male Today's Date: 01/11/2021   PCP: Patient, No Pcp Per (Inactive) REFERRING PROVIDER: Ladell Pier, MD    See note below for Objective Data and Assessment of Progress/Goals.         PT End of Session - 02/15/21        Visit Number 15    Number of Visits 17     Date for PT Re-Evaluation 03/02/21     Authorization Type BCBS COMM PPO Reassess FOTO on 11th visits     PT Start Time 1103    PT Stop Time 1148    PT Time Calculation (min) 45    Activity Tolerance Patient tolerated treatment well     Behavior During Therapy [facility] for tasks assessed                      History reviewed. No pertinent past medical history. History reviewed. No pertinent surgical history.     Patient Active Problem List    Diagnosis Date Noted   Acute bilateral low back pain without sciatica 12/16/2020   Essential hypertension 12/16/2020   Gastroesophageal reflux disease without esophagitis 12/04/2020   Aortic atherosclerosis ([place]) 12/04/2020   Elevated blood pressure reading in office without diagnosis of hypertension 12/04/2020   History of tobacco abuse 12/04/2020   Pulmonary emphysema ([place]) 12/04/2020   BMI 20.0-20.9, adult 12/04/2020    REFERRING PROVIDER: Ladell Pier, MD    REFERRING DIAG: Chest pain, mid sternal. Acute bilateral low back pain without sciatic    ONSET DATE: 11/26/20   THERAPY DIAG:  Acute bilateral low back pain without sciatica   Chest pain, mid sternal   Muscle weakness (generalized)   PERTINENT HISTORY: PERTINENT HISTORY:  NA per SnapShot and pt report   PRECAUTIONS: PRECAUTIONS: Sternal- Pt reports being told to limit activity, complete as tolerated   WEIGHT BEARING RESTRICTIONS No   SUBJECTIVE: Pt reports he is a little sore all over after the last PT session.   PAIN:  Are you having pain? VAS scale: 1/10 for low back  L>R, pain range for his low back is 0-3/10. 1/10 for strenum and chest (only pain if he lies in his stomach) Pain location: lower back and chest Pain orientation: Lower back, sternum PAIN TYPE: Intermittent Pain description: sore  Aggravating factors: bending over, twisting. Sitting too long Relieving factors: lay down, heating/massage pad       OBJECTIVE:    DIAGNOSTIC FINDINGS:  IMPRESSION: 1. Osteopenia and degenerative change without evidence of acute fracture. 2. 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   IMPRESSION: 1. Subtle nondisplaced fracture of the proximal sternum, approximately 3 cm inferior to the sternomanubrial junction on lateral projection.     PATIENT SURVEYS:  FOTO 37% perceived functional abiltiy, 01/16/21=54%. 02/15/21=74%   SCREENING FOR RED FLAGS: Bowel or bladder incontinence: No   COGNITION:          Overall cognitive status: Within functional limits for tasks assessed                        SENSATION:          Light touch: Appears intact   MUSCLE LENGTH: Hamstrings: Right TBA deg; Left TBA deg   POSTURE:  Forward head, rounded shoulders,  decreased thoracic, decreased lordosis   PALPATION: TTP for the lumbar paraspinals   LUMBARAROM/PROM   A/PROM A/PROM  12/28/2020  Flexion Mod limited, hands to knees, provoked LBP  Extension Mod limited, most significantly provoked L LBP and LE pain    Right lateral flexion Mod limited, provoked L LBP and LE pain  Left lateral flexion Mod limited, provoked L LBP and LE pain  Right rotation Min limited, provoked R LBP and LE pain  Left rotation Min limited, provoked L LBP and LE pain   (Blank rows = not tested)   LE AROM/PROM:   A/PROM Right 12/28/2020 Left 12/28/2020  Hip flexion      Hip extension      Hip abduction      Hip adduction      Hip internal rotation      Hip external rotation      Knee flexion       Knee extension      Ankle dorsiflexion      Ankle plantarflexion      Ankle inversion      Ankle eversion       (Blank rows = not tested)   LE MMT:   MMT Right 12/28/2020 Left 12/28/2020  Hip flexion 4+ 4  Hip extension 4+ 4  Hip abduction 4+ 4  Hip adduction 5 5  Hip internal rotation 5 4+  Hip external rotation 4+ 4  Knee flexion 5 5  Knee extension 5 5  Ankle dorsiflexion 5 5  Ankle plantarflexion 5 5  Ankle inversion      Ankle eversion       (Blank rows = not tested) MMT for both hips was limited due to pain per pt's reports, L more so than R     LUMBAR SPECIAL TESTS:  Slump test: Negative SI Comp-positive/distraction-negative   FUNCTIONAL TESTS:  5 times sit to stand: 01/03/21: 68.42 sec (with UE assist)  6 minute walk test: 01/03/21: 748 feet (pain increased from 3/10 to 5/10)   GAIT: Distance walked: In clinic Assistive device utilized: None Level of assistance: Complete Independence Comments: Pt walks at a decrease pace             TRANSFERS: Sit stand decreased pain with report of pain               Eval for sternal pain 01/05/21               ROM: Shoulder ROMs were found to be symmetrical and WNLs. ER to T6 bilat. IR to T10 bilat.               Strength: Testing was modified limiting resistance related to pt's report of sternal discomfort. B shoulders were found to be 4/5   [facility] Adult PT Treatment:                                                DATE: 02/15/21 Therapeutic Exercise: Nu-step 5 min, L8, UE/LE   Seated lumbar flexion forward and laterally with physioball 10 sec x 5 Dead bug arms/legs, 2x20 Bird dog arm/legs, 2x20 Prone plank, x5 10" SL plank x5 10" L and R Resisted forward, backward, side stepping c FM, 8x8' each Bicep curls, 3x10, 10# Shoulder abd to 90d, 3x10, 6# Lat pulls, OG, 3x10, 25   Self Care: Reviewed results  of today's [facility] Adult PT Treatment:                                                DATE:  02/13/21 Therapeutic Exercise: Seated lumbar flexion forward and laterally with physioball 10 sec x 5 Bridging c theraball, 2x15 Abdominal bracing legs 90/90 and arms 90 x5, 10" Dead bug arms/legs c theraball, 2x10 Quad, alt arms/legs 2x10 Cybex hip abd, 2x10, 32# Therapeutic Activity: Waist to shoulder height lifts, 3x10, #20, BUEs Knee to waist height lifts, 2x10, 45#, 90d turn    [facility] Adult PT Treatment:                                                DATE: 02/08/21 Therapeutic Exercise: Nu-step 5 min, L8, UE/LE Bridging c green theraball, 2x10, 5" Seated lumbar flexion forward and laterally with physioball 10 sec x 5 Dead bug arms/legs, 2x10 Quad, alt arms/legs 2x10 Cybex leg press, 3x10, 100# Cybex hip abd, 3x10, 32# Cybex hip ext,3x10, 32#    PATIENT EDUCATION:  Education details: FOTO score and predictions Person educated: Patient Education method: Explanation, Demonstration, Tactile cues, Verbal cues, and Handouts Education comprehension: verbalized understanding, returned demonstration, verbal cues required, and tactile cues required     HOME EXERCISE PROGRAM:             Access Code: [email or web address] URL: [email or web address] Date: 01/05/2021 Prepared by: Gar Ponto   Exercises Supine Bridge - 2 x daily - 7 x weekly - 1 sets - 10 reps - 3 hold Supine Posterior Pelvic Tilt - 2 x daily - 7 x weekly - 1 sets - 10 reps - 3 hold Hooklying Single Knee to Chest Stretch - 2 x daily - 7 x weekly - 1 sets - 3 reps - 15 hold Hooklying Clamshell with Resistance - 1 x daily - 7 x weekly - 1 sets - 10 reps - 3 hold Supine Hip Adduction Isometric with Ball - 1 x daily - 7 x weekly - 1 sets - 10 reps - 3 hold Standing Shoulder Row with Anchored Resistance - 1 x daily - 7 x weekly - 1 sets - 10 reps - 3 hold Shoulder Extension with Resistance - 1 x daily - 7 x weekly - 1 sets - 10 reps - 3 hold Shoulder External Rotation and Scapular Retraction with Resistance - 1 x daily -  7 x weekly - 1 sets - 10 reps - 3 hold Standing Serratus Punch with Resistance - 1 x daily - 7 x weekly - 1 sets - 10 reps - 3 hold Doorway Pec Stretch at 90 Degrees Abduction - 1 x daily - 7 x weekly - 1 sets - 3 reps - 20 hold     ASSESSMENT:   CLINICAL IMPRESSION: After the last PT session with increased lifting demand, pt reports today only general soreness which is an appropriate response.  Pt participated in PT today for UE, core and LE strengthening. Work load demand was progressed for resistance and repetitions. Pt tolerated without adverse effects. Pt's FOTO was reassessed with pt's perceived functional level exceeding predicted. Will continue to progress physical demand as tolerated for preparation for RTW. Will complete more lifting activities the  next session.    REHAB POTENTIAL: Good   CLINICAL DECISION MAKING: Stable/uncomplicated   EVALUATION COMPLEXITY: Low     GOALS:     SHORT TERM GOALS:   STG Name Target Date Goal status  1 Pt will be ind in an initial HEP Baseline: started on eval 01/25/21 Met  2 Pt will voice understanding of measures to assist in the reduction of pain. Heat, cold, massage Baseline:  01/25/21 Met  3 Complete eval for sternal pain and set goals 01/05/22 Met    LONG TERM GOALS:    LTG Name Target Date Goal status  1 Improve pt's trunk ROM to minimal limitation for improved function Baseline: Moderate limitations 03/02/21 INITIAL  2 Improve bilat hip strength to 4+/5 or greater for improved function. 11/24: bilat hip strength= 4+/5 Baseline: See flowsheet 03/02/21 Met 01/30/21  3 Pt will report a decrease in LBP and sternal to 3/10 or less c daily and work related activity  Baseline:low back 0-6/10. Sternum 4-8/10 03/02/21 INITIAL  4 Pt will be ind in proper lifting techniques for return to work Baseline: 03/02/21 INITIAL  5 Increase FOTO perceived functional ability score to predicted value of 62%. 02/15/21=74% Baseline: 03/02/21 Met 02/15/21  6  Determine physical demands and sets to help determine readiness for return to work. 01/09/21: Will be demonstrate proper technique for floor to waist lifting of 40# for 2x10 reps and of 20# from waist to shoulder height 2x10 to demonstrate functional abilities for return to work. Baseline: 03/02/21 INITIAL  7  Pt will demostrate 5/5 strength for B UEs Baseline: 03/02/21  INITIAL   8 Improve 5xSTS to 15 sec or less c or s use of arms Baseline: 68.4 sec 03/02/21 INITIAL  9 Improve pt's 6MWT to 104f  Baseline: 7424f2/24/23 INITIAL    PLAN: PT FREQUENCY: 2x/week   PT DURATION: 8 weeks   PLANNED INTERVENTIONS: Therapeutic exercises, Therapeutic activity, Neuro Muscular re-education, Balance training, Gait training, Patient/Family education, Joint mobilization, Dry Needling, Electrical stimulation, Spinal mobilization, Cryotherapy, Moist heat, Taping, Traction, Ultrasound, Ionotophoresis 73m60ml Dexamethasone, and Manual therapy   PLAN FOR NEXT SESSION: Progress therex demand including lifting as tolerated, progress HEP.   Bryli Mantey MS, PT 02/15/21 12:09 PM

## 2021-02-20 ENCOUNTER — Ambulatory Visit: Payer: BC Managed Care – PPO

## 2021-02-20 ENCOUNTER — Other Ambulatory Visit: Payer: Self-pay

## 2021-02-20 DIAGNOSIS — M545 Low back pain, unspecified: Secondary | ICD-10-CM | POA: Diagnosis not present

## 2021-02-20 DIAGNOSIS — M5386 Other specified dorsopathies, lumbar region: Secondary | ICD-10-CM

## 2021-02-20 DIAGNOSIS — R0789 Other chest pain: Secondary | ICD-10-CM

## 2021-02-20 DIAGNOSIS — M6281 Muscle weakness (generalized): Secondary | ICD-10-CM

## 2021-02-20 NOTE — Therapy (Addendum)
OUTPATIENT PHYSICAL THERAPY TREATMENT NOTE     Patient Name: Donald Cherry MRN: [id number] DOB:05/17/1975, 46 y.o., male Today's Date: 01/11/2021   PCP: Patient, No Pcp Per (Inactive) REFERRING PROVIDER: Ladell Pier, MD    See note below for Objective Data and Assessment of Progress/Goals.         PT End of Session - 02/20/21        Visit Number 16    Number of Visits 17     Date for PT Re-Evaluation 03/02/21     Authorization Type BCBS COMM PPO Reassess FOTO on 11th visits     PT Start Time 1104    PT Stop Time 1145    PT Time Calculation (min) 41    Activity Tolerance Patient tolerated treatment well     Behavior During Therapy [facility] for tasks assessed                      History reviewed. No pertinent past medical history. History reviewed. No pertinent surgical history.     Patient Active Problem List    Diagnosis Date Noted   Acute bilateral low back pain without sciatica 12/16/2020   Essential hypertension 12/16/2020   Gastroesophageal reflux disease without esophagitis 12/04/2020   Aortic atherosclerosis ([place]) 12/04/2020   Elevated blood pressure reading in office without diagnosis of hypertension 12/04/2020   History of tobacco abuse 12/04/2020   Pulmonary emphysema ([place]) 12/04/2020   BMI 20.0-20.9, adult 12/04/2020    REFERRING PROVIDER: Ladell Pier, MD    REFERRING DIAG: Chest pain, mid sternal. Acute bilateral low back pain without sciatic    ONSET DATE: 11/26/20   THERAPY DIAG:  Acute bilateral low back pain without sciatica   Chest pain, mid sternal   Muscle weakness (generalized)   PERTINENT HISTORY: PERTINENT HISTORY:  NA per SnapShot and pt report   PRECAUTIONS: PRECAUTIONS: Sternal- Pt reports being told to limit activity, complete as tolerated   WEIGHT BEARING RESTRICTIONS No   SUBJECTIVE: Pt reports he is doing well with no back pain today. Both low back and chest bother him occasionally at a low level.   PAIN:   Are you having pain? VAS scale: 1/10 for low back L>R, pain range for his low back is 0-3/10. 1/10 for strenum and chest (pain if he lies in his stomach or lifting Pain location: lower back and chest Pain orientation: Lower back, sternum PAIN TYPE: Intermittent Pain description: sore  Aggravating factors: bending over, twisting. Sitting too long Relieving factors: lay down, heating/massage pad    OBJECTIVE:    DIAGNOSTIC FINDINGS:  IMPRESSION: 1. Osteopenia and degenerative change without evidence of acute fracture. 2. 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   IMPRESSION: 1. Subtle nondisplaced fracture of the proximal sternum, approximately 3 cm inferior to the sternomanubrial junction on lateral projection.     PATIENT SURVEYS:  FOTO 37% perceived functional abiltiy, 01/16/21=54%. 02/15/21=74%   SCREENING FOR RED FLAGS: Bowel or bladder incontinence: No   COGNITION:          Overall cognitive status: Within functional limits for tasks assessed                        SENSATION:          Light touch: Appears intact   MUSCLE LENGTH: Hamstrings: Right TBA deg; Left TBA deg  POSTURE:  Forward head, rounded shoulders, decreased thoracic, decreased lordosis   PALPATION: TTP for the lumbar paraspinals   LUMBARAROM/PROM   A/PROM A/PROM  12/28/2020  Flexion Mod limited, hands to knees, provoked LBP  Extension Mod limited, most significantly provoked L LBP and LE pain    Right lateral flexion Mod limited, provoked L LBP and LE pain  Left lateral flexion Mod limited, provoked L LBP and LE pain  Right rotation Min limited, provoked R LBP and LE pain  Left rotation Min limited, provoked L LBP and LE pain   (Blank rows = not tested)   LE AROM/PROM:   A/PROM Right 12/28/2020 Left 12/28/2020  Hip flexion      Hip extension      Hip abduction      Hip adduction      Hip internal  rotation      Hip external rotation      Knee flexion      Knee extension      Ankle dorsiflexion      Ankle plantarflexion      Ankle inversion      Ankle eversion       (Blank rows = not tested)   LE MMT:   MMT Right 12/28/2020 Left 12/28/2020  Hip flexion 4+ 4  Hip extension 4+ 4  Hip abduction 4+ 4  Hip adduction 5 5  Hip internal rotation 5 4+  Hip external rotation 4+ 4  Knee flexion 5 5  Knee extension 5 5  Ankle dorsiflexion 5 5  Ankle plantarflexion 5 5  Ankle inversion      Ankle eversion       (Blank rows = not tested) MMT for both hips was limited due to pain per pt's reports, L more so than R     LUMBAR SPECIAL TESTS:  Slump test: Negative SI Comp-positive/distraction-negative   FUNCTIONAL TESTS:  5 times sit to stand: 01/03/21: 68.42 sec (with UE assist)  6 minute walk test: 01/03/21: 748 feet (pain increased from 3/10 to 5/10)   GAIT: Distance walked: In clinic Assistive device utilized: None Level of assistance: Complete Independence Comments: Pt walks at a decrease pace             TRANSFERS: Sit stand decreased pain with report of pain               Eval for sternal pain 01/05/21               ROM: Shoulder ROMs were found to be symmetrical and WNLs. ER to T6 bilat. IR to T10 bilat.               Strength: Testing was modified limiting resistance related to pt's report of sternal discomfort. B shoulders were found to be 4/5  [facility] Adult PT Treatment:                                                DATE: 02/20/21 Therapeutic Exercise: Elliptical 5 min, L1, R1, UE/LE, 2.5 in each direction  Seated lumbar flexion forward and laterally with physioball 10 sec x 5 Dead bug arms/legs, 2x20 Bird dog arm/legs, 2x20 Cybex leg press, 2x10, 100# Cybex hip abd, 3x10, 32# Therapeutic Activity: Waist to shoulder height lifts, 3x10, #25, BUEs Knee to waist height lifts, 3x10, 45#, 90d turn   [facility]  Adult PT Treatment:                                                 DATE: 02/15/21 Therapeutic Exercise: Nu-step 5 min, L8, UE/LE   Seated lumbar flexion forward and laterally with physioball 10 sec x 5 Dead bug arms/legs, 2x20 Bird dog arm/legs, 2x20 Prone plank, x5 10" SL plank x5 10" L and R Resisted forward, backward, side stepping c FM, 8x8' each Bicep curls, 3x10, 10# Shoulder abd to 90d, 3x10, 6# Lat pulls, OG, 3x10, 25   Self Care: Reviewed results of today's FOTO  [facility] Adult PT Treatment:                                                DATE: 02/13/21 Therapeutic Exercise: Seated lumbar flexion forward and laterally with physioball 10 sec x 5 Bridging c theraball, 2x15 Abdominal bracing legs 90/90 and arms 90 x5, 10" Dead bug arms/legs c theraball, 2x10 Quad, alt arms/legs 2x10 Cybex hip abd, 2x10, 32# Therapeutic Activity: Waist to shoulder height lifts, 3x10, #20, BUEs Knee to waist height lifts, 2x10, 45#, 90d turn    PATIENT EDUCATION:  Education details: FOTO score and predictions Person educated: Patient Education method: Explanation, Demonstration, Tactile cues, Verbal cues, and Handouts Education comprehension: verbalized understanding, returned demonstration, verbal cues required, and tactile cues required     HOME EXERCISE PROGRAM:             Access Code: [email or web address] URL: [email or web address] Date: 01/05/2021 Prepared by: Gar Ponto   Exercises Supine Bridge - 2 x daily - 7 x weekly - 1 sets - 10 reps - 3 hold Supine Posterior Pelvic Tilt - 2 x daily - 7 x weekly - 1 sets - 10 reps - 3 hold Hooklying Single Knee to Chest Stretch - 2 x daily - 7 x weekly - 1 sets - 3 reps - 15 hold Hooklying Clamshell with Resistance - 1 x daily - 7 x weekly - 1 sets - 10 reps - 3 hold Supine Hip Adduction Isometric with Ball - 1 x daily - 7 x weekly - 1 sets - 10 reps - 3 hold Standing Shoulder Row with Anchored Resistance - 1 x daily - 7 x weekly - 1 sets - 10 reps - 3 hold Shoulder Extension with Resistance - 1 x  daily - 7 x weekly - 1 sets - 10 reps - 3 hold Shoulder External Rotation and Scapular Retraction with Resistance - 1 x daily - 7 x weekly - 1 sets - 10 reps - 3 hold Standing Serratus Punch with Resistance - 1 x daily - 7 x weekly - 1 sets - 10 reps - 3 hold Doorway Pec Stretch at 90 Degrees Abduction - 1 x daily - 7 x weekly - 1 sets - 3 reps - 20 hold     ASSESSMENT:   CLINICAL IMPRESSION: Pt participated in PT for lumbopelvic flexibility and strengthening. Lifting activities simulating pt's work with increased physical demand were also completed. Pt tolerated a PT session today with greater physical demand without adverse effects. Pt is progressing well with PT and anticipate DC with his last scheduled appt next week if pt does well with an  assessment at that time.    REHAB POTENTIAL: Good   CLINICAL DECISION MAKING: Stable/uncomplicated   EVALUATION COMPLEXITY: Low     GOALS:     SHORT TERM GOALS:   STG Name Target Date Goal status  1 Pt will be ind in an initial HEP Baseline: started on eval 01/25/21 Met  2 Pt will voice understanding of measures to assist in the reduction of pain. Heat, cold, massage Baseline:  01/25/21 Met  3 Complete eval for sternal pain and set goals 01/05/22 Met    LONG TERM GOALS:    LTG Name Target Date Goal status  1 Improve pt's trunk ROM to minimal limitation for improved function Baseline: Moderate limitations 03/02/21 INITIAL  2 Improve bilat hip strength to 4+/5 or greater for improved function. 11/24: bilat hip strength= 4+/5 Baseline: See flowsheet 03/02/21 Met 01/30/21  3 Pt will report a decrease in LBP and sternal to 3/10 or less c daily and work related activity  Baseline:low back 0-6/10. Sternum 4-8/10 03/02/21 INITIAL  4 Pt will be ind in proper lifting techniques for return to work Baseline: 03/02/21 INITIAL  5 Increase FOTO perceived functional ability score to predicted value of 62%. 02/15/21=74% Baseline: 03/02/21 Met 02/15/21  6  Determine physical demands and sets to help determine readiness for return to work. 01/09/21: Will be demonstrate proper technique for floor to waist lifting of 40# for 2x10 reps and of 20# from waist to shoulder height 2x10 to demonstrate functional abilities for return to work. Baseline: 03/02/21 INITIAL  7  Pt will demostrate 5/5 strength for B UEs Baseline: 03/02/21  INITIAL   8 Improve 5xSTS to 15 sec or less c or s use of arms Baseline: 68.4 sec 03/02/21 INITIAL  9 Improve pt's 6MWT to 1074f  Baseline: 7435f2/24/23 INITIAL    PLAN: PT FREQUENCY: 2x/week   PT DURATION: 8 weeks   PLANNED INTERVENTIONS: Therapeutic exercises, Therapeutic activity, Neuro Muscular re-education, Balance training, Gait training, Patient/Family education, Joint mobilization, Dry Needling, Electrical stimulation, Spinal mobilization, Cryotherapy, Moist heat, Taping, Traction, Ultrasound, Ionotophoresis 30m130ml Dexamethasone, and Manual therapy   PLAN FOR NEXT SESSION: Progress therex demand including lifting as tolerated, progress HEP.   Shadrach Bartunek MS, PT 02/28/21 3:07 PM

## 2021-02-22 ENCOUNTER — Ambulatory Visit: Payer: BC Managed Care – PPO

## 2021-02-27 ENCOUNTER — Other Ambulatory Visit: Payer: Self-pay

## 2021-02-27 ENCOUNTER — Ambulatory Visit: Payer: BC Managed Care – PPO

## 2021-02-27 DIAGNOSIS — M545 Low back pain, unspecified: Secondary | ICD-10-CM

## 2021-02-27 DIAGNOSIS — M6281 Muscle weakness (generalized): Secondary | ICD-10-CM

## 2021-02-27 DIAGNOSIS — M5386 Other specified dorsopathies, lumbar region: Secondary | ICD-10-CM

## 2021-02-27 DIAGNOSIS — R0789 Other chest pain: Secondary | ICD-10-CM

## 2021-02-27 NOTE — Therapy (Signed)
OUTPATIENT PHYSICAL THERAPY TREATMENT NOTE     Patient Name: Donald Cherry MRN: [id number] DOB:Apr 12, 1975, 46 y.o., male Today's Date: 01/11/2021   PCP: Patient, No Pcp Per (Inactive) REFERRING PROVIDER: Ladell Pier, MD    See note below for Objective Data and Assessment of Progress/Goals.         PT End of Session - 02/20/21        Visit Number 17    Number of Visits 17     Date for PT Re-Evaluation 03/02/21     Authorization Type BCBS COMM PPO    PT Start Time 1103    PT Stop Time 1148    PT Time Calculation (min) 45    Activity Tolerance Patient tolerated treatment well     Behavior During Therapy WFL for tasks assessed                      History reviewed. No pertinent past medical history. History reviewed. No pertinent surgical history.     Patient Active Problem List    Diagnosis Date Noted   Acute bilateral low back pain without sciatica 12/16/2020   Essential hypertension 12/16/2020   Gastroesophageal reflux disease without esophagitis 12/04/2020   Aortic atherosclerosis ([place]) 12/04/2020   Elevated blood pressure reading in office without diagnosis of hypertension 12/04/2020   History of tobacco abuse 12/04/2020   Pulmonary emphysema ([place]) 12/04/2020   BMI 20.0-20.9, adult 12/04/2020    REFERRING PROVIDER: Ladell Pier, MD    REFERRING DIAG: Chest pain, mid sternal. Acute bilateral low back pain without sciatic    ONSET DATE: 11/26/20   THERAPY DIAG:  Acute bilateral low back pain without sciatica   Chest pain, mid sternal   Muscle weakness (generalized)   PERTINENT HISTORY: PERTINENT HISTORY:  NA per SnapShot and pt report   PRECAUTIONS: PRECAUTIONS: Sternal- Pt reports being told to limit activity, complete as tolerated   WEIGHT BEARING RESTRICTIONS No   SUBJECTIVE: Pt report he is not in pain most of the time. He reports low level back with prolonged sitting and lifting and sternum pain with lying on his chest and  lifting.    PAIN:  Are you having pain? VAS scale: 0/10 for low back and sternum. Pain range for his low back is 0-2/10. 0-3/10 for strenum. Pain location: lower back and chest Pain orientation: Lower back, sternum PAIN TYPE: Intermittent Pain description: sore  Aggravating factors: bending over, twisting. Sitting too long Relieving factors: lay down, heating/massage pad    OBJECTIVE:    DIAGNOSTIC FINDINGS:  IMPRESSION: 1. Osteopenia and degenerative change without evidence of acute fracture. 2. 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   IMPRESSION: 1. Subtle nondisplaced fracture of the proximal sternum, approximately 3 cm inferior to the sternomanubrial junction on lateral projection.     PATIENT SURVEYS:  FOTO 37% perceived functional abiltiy, 01/16/21=54%. 02/15/21=74%   SCREENING FOR RED FLAGS: Bowel or bladder incontinence: No   COGNITION:          Overall cognitive status: Within functional limits for tasks assessed                        SENSATION:          Light touch: Appears intact   MUSCLE LENGTH: Hamstrings: Right TBA deg; Left TBA deg   POSTURE:  Forward head, rounded shoulders,  decreased thoracic, decreased lordosis   PALPATION: TTP for the lumbar paraspinals   LUMBARAROM/PROM   A/PROM A/PROM  12/28/2020 AROM 02/27/21  Flexion Mod limited, hands to knees, provoked LBP Full, no provocation of pain  Extension Mod limited, most significantly provoked L LBP and LE pain   Full, no provocation of pain  Right lateral flexion Mod limited, provoked L LBP and LE pain Full, no provocation of pain  Left lateral flexion Mod limited, provoked L LBP and LE pain Full, no provocation of pain  Right rotation Min limited, provoked R LBP and LE pain Full, no provocation of pain  Left rotation Min limited, provoked L LBP and LE pain Full, no provocation of pain   (Blank rows = not  tested)   LE AROM/PROM:   A/PROM Right 12/28/2020 Left 12/28/2020  Hip flexion      Hip extension      Hip abduction      Hip adduction      Hip internal rotation      Hip external rotation      Knee flexion      Knee extension      Ankle dorsiflexion      Ankle plantarflexion      Ankle inversion      Ankle eversion       (Blank rows = not tested)   LE MMT:   MMT Right 12/28/2020 Left 12/28/2020  Hip flexion 4+ 4  Hip extension 4+ 4  Hip abduction 4+ 4  Hip adduction 5 5  Hip internal rotation 5 4+  Hip external rotation 4+ 4  Knee flexion 5 5  Knee extension 5 5  Ankle dorsiflexion 5 5  Ankle plantarflexion 5 5  Ankle inversion      Ankle eversion       (Blank rows = not tested) MMT for both hips was limited due to pain per pt's reports, L more so than R     LUMBAR SPECIAL TESTS:  Slump test: Negative SI Comp-positive/distraction-negative   FUNCTIONAL TESTS:  5 times sit to stand: 01/03/21: 68.42 sec (with UE assist)  6 minute walk test: 01/03/21: 748 feet (pain increased from 3/10 to 5/10)   GAIT: Distance walked: In clinic Assistive device utilized: None Level of assistance: Complete Independence Comments: Pt walks at a decrease pace             TRANSFERS: Sit stand decreased pain with report of pain               Eval for sternal pain 01/05/21               ROM: Shoulder ROMs were found to be symmetrical and WNLs. ER to T6 bilat. IR to T10 bilat.               Strength: Testing was modified limiting resistance related to pt's report of sternal discomfort. B shoulders were found to be 4/5. 02/27/21: B UEs demonstrated 5/5 pain without provocation of pain.  [facility] Adult PT Treatment:                                                DATE: 02/27/21 Therapeutic Exercise: UBE 5 mins L1 2.5 mins forward and backwards Seated lumbar flexion forward and laterally with physioball 10 sec x 5 Dead bug arms/legs, 2x20 Bird dog  arm/legs, 2x20 Therapeutic  Activity: Waist to shoulder height lifts, 3x10, #25, BUEs Knee to waist height lifts, 3x10, 45#, 90d turn  5xST 10.3 sec 6MW  1430f Self Care: Final HEP   [facility] Adult PT Treatment:                                                DATE: 02/20/21 Therapeutic Exercise: Elliptical 5 min, L1, R1, UE/LE, 2.5 in each direction  Seated lumbar flexion forward and laterally with physioball 10 sec x 5 Dead bug arms/legs, 2x20 Bird dog arm/legs, 2x20 Cybex leg press, 2x10, 100# Cybex hip abd, 3x10, 32# Therapeutic Activity: Waist to shoulder height lifts, 3x10, #25, BUEs Knee to waist height lifts, 3x10, 45#, 90d turn    [facility] Adult PT Treatment:                                                DATE: 02/15/21 Therapeutic Exercise: Nu-step 5 min, L8, UE/LE   Seated lumbar flexion forward and laterally with physioball 10 sec x 5 Dead bug arms/legs, 2x20 Bird dog arm/legs, 2x20 Prone plank, x5 10" SL plank x5 10" L and R Resisted forward, backward, side stepping c FM, 8x8' each Bicep curls, 3x10, 10# Shoulder abd to 90d, 3x10, 6# Lat pulls, OG, 3x10, 25   Self Care: Reviewed results of today's FOTO   PATIENT EDUCATION:  Education details: FOTO score and predictions Person educated: Patient Education method: Explanation, Demonstration, Tactile cues, Verbal cues, and Handouts Education comprehension: verbalized understanding, returned demonstration, verbal cues required, and tactile cues required     HOME EXERCISE PROGRAM:             Access Code: [email or web address]: [email or web address] Date: 01/05/2021 Prepared by: AGar Ponto  Exercises Supine Bridge - 2 x daily - 7 x weekly - 1 sets - 10 reps - 3 hold Supine Posterior Pelvic Tilt - 2 x daily - 7 x weekly - 1 sets - 10 reps - 3 hold Hooklying Single Knee to Chest Stretch - 2 x daily - 7 x weekly - 1 sets - 3 reps - 15 hold Hooklying Clamshell with Resistance - 1 x daily - 7 x weekly - 1 sets - 10 reps - 3 hold Supine Hip  Adduction Isometric with Ball - 1 x daily - 7 x weekly - 1 sets - 10 reps - 3 hold Standing Shoulder Row with Anchored Resistance - 1 x daily - 7 x weekly - 1 sets - 10 reps - 3 hold Shoulder Extension with Resistance - 1 x daily - 7 x weekly - 1 sets - 10 reps - 3 hold Shoulder External Rotation and Scapular Retraction with Resistance - 1 x daily - 7 x weekly - 1 sets - 10 reps - 3 hold Standing Serratus Punch with Resistance - 1 x daily - 7 x weekly - 1 sets - 10 reps - 3 hold Doorway Pec Stretch at 90 Degrees Abduction - 1 x daily - 7 x weekly - 1 sets - 3 reps - 20 hold     ASSESSMENT:   CLINICAL IMPRESSION: Pt participated in his last scheduled appt. Pt's strength, ROM, and function have all made significant gains meeting all  set PT goals. Pt is lifting multiple repetitions and sets of weights consistent with his work duties and tolerating them minimal low back and sternal pain and general muscle soreness. The demands of the pt's job can not be fully replicated and recommend return to work ar a modified duty with a gradual increase to full duty. Will leave pt's chart open for 2 weeks to insure RTW without additional need for PT. In the 2 week time frame, pt my request additional PT as indicated.   REHAB POTENTIAL: Good   CLINICAL DECISION MAKING: Stable/uncomplicated   EVALUATION COMPLEXITY: Low     GOALS:     SHORT TERM GOALS:   STG Name Target Date Goal status  1 Pt will be ind in an initial HEP Baseline: started on eval 01/25/21 Met  2 Pt will voice understanding of measures to assist in the reduction of pain. Heat, cold, massage Baseline:  01/25/21 Met  3 Complete eval for sternal pain and set goals 01/05/22 Met    LONG TERM GOALS:    LTG Name Target Date Goal status  1 Improve pt's trunk ROM to minimal limitation for improved function. 02/27/21=All motion are full Baseline: Moderate limitations 03/02/21 Met 02/27/21  2 Improve bilat hip strength to 4+/5 or greater for  improved function. 11/24: bilat hip strength= 4+/5 Baseline: See flowsheet 03/02/21 Met 01/30/21  3 Pt will report a decrease in LBP and sternal to 3/10 or less c daily and work related activity  Baseline:low back 0-6/10. Sternum 4-8/10 03/02/21 Met 02/27/21  4 Pt will be ind in proper lifting techniques for return to work Baseline: 03/02/21 Met 02/27/21  5 Increase FOTO perceived functional ability score to predicted value of 62%. 02/15/21=74% Baseline: 03/02/21 Met 02/15/21  6 Determine physical demands and sets to help determine readiness for return to work. 01/09/21: Will be demonstrate proper technique for floor to waist lifting of 40# for 2x10 reps and of 20# from waist to shoulder height 2x10 to demonstrate functional abilities for return to work. Baseline: 03/02/21 Met 02/27/21  7  Pt will demostrate 5/5 strength for B Ues. 02/27/21=se flow sheets Baseline: 03/02/21  Met 02/28/20  8 Improve 5xSTS to 15 sec or less c or s use of arms. 02/27/21=10.3 sec Baseline: 68.4 sec 03/02/21 Met 02/27/21  9 Improve pt's 6MWT to 1041f 02/27/21= [id number]: 74858f/24/23 Met 02/27/21    PLAN: PT FREQUENCY: 2x/week   PT DURATION: 8 weeks   PLANNED INTERVENTIONS: Therapeutic exercises, Therapeutic activity, Neuro Muscular re-education, Balance training, Gait training, Patient/Family education, Joint mobilization, Dry Needling, Electrical stimulation, Spinal mobilization, Cryotherapy, Moist heat, Taping, Traction, Ultrasound, Ionotophoresis 4mg47m Dexamethasone, and Manual therapy   PLAN FOR NEXT SESSION: Will leave pt's chart open for 2 weeks to insure RTW without additional need for PT. In the 2 week time frame, pt my request additional PT as indicated.    Raudel Bazen MS, PT 02/28/21 10:59 AM

## 2021-03-01 ENCOUNTER — Ambulatory Visit: Payer: BC Managed Care – PPO

## 2021-03-01 NOTE — Telephone Encounter (Signed)
Patient has forms that need to be filled out by tomorrow. Advised that Dr.Johnson is out of the office until Tuesday, but would send a message back as a FYI. Patient also said he will contact the people who are handing his short term disability to see if they will give him an extension.

## 2021-03-01 NOTE — Telephone Encounter (Signed)
Copied from [place] [phone]. Topic: Appointment Scheduling - Scheduling Inquiry for Clinic >> Mar 01, 2021 10:17 AM Tessa Lerner A wrote: Reason for CRM: The patient has been made aware that their appt has been changed to a phone appt for 03/02/21 at 2:50 PM  The patient needed this appt to be in person for the completion of paperwork, that the patient previously discussed with member of staff  The patient shares that the paperwork is related to their job and they needed this appt for it's completion   The patient shares that they're able to drop off the paperwork today if needed   Please contact the patient to further discuss appt modifications

## 2021-03-01 NOTE — Telephone Encounter (Signed)
Returned pt call and made aware to drop paperwork. Made pt aware to let provider know during his virtual visit in regards to the paperwork so she can document so that way when provider gets paperwork she can fill it out. Pt states he understands and pt will drop paperwork off after lunch

## 2021-03-02 ENCOUNTER — Ambulatory Visit: Payer: BC Managed Care – PPO | Admitting: Internal Medicine

## 2021-03-07 ENCOUNTER — Other Ambulatory Visit: Payer: Self-pay

## 2021-03-07 ENCOUNTER — Encounter: Payer: Self-pay | Admitting: Internal Medicine

## 2021-03-07 ENCOUNTER — Ambulatory Visit: Payer: BC Managed Care – PPO | Attending: Internal Medicine | Admitting: Internal Medicine

## 2021-03-07 VITALS — BP 144/91 | HR 89 | Resp 16 | Wt 165.0 lb

## 2021-03-07 DIAGNOSIS — G8929 Other chronic pain: Secondary | ICD-10-CM

## 2021-03-07 DIAGNOSIS — M545 Low back pain, unspecified: Secondary | ICD-10-CM

## 2021-03-07 DIAGNOSIS — S2220XA Unspecified fracture of sternum, initial encounter for closed fracture: Secondary | ICD-10-CM

## 2021-03-07 DIAGNOSIS — I10 Essential (primary) hypertension: Secondary | ICD-10-CM | POA: Diagnosis not present

## 2021-03-07 MED ORDER — AMLODIPINE BESYLATE 10 MG PO TABS: 10.0000 mg | ORAL_TABLET | Freq: Every day | ORAL | 6 refills | Status: DC
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
## 2021-03-12 ENCOUNTER — Ambulatory Visit: Payer: BC Managed Care – PPO | Admitting: Internal Medicine

## 2021-04-05 ENCOUNTER — Ambulatory Visit: Payer: BC Managed Care – PPO | Attending: Internal Medicine | Admitting: Pharmacist

## 2021-04-05 ENCOUNTER — Encounter: Payer: Self-pay | Admitting: Pharmacist

## 2021-04-05 DIAGNOSIS — K219 Gastro-esophageal reflux disease without esophagitis: Secondary | ICD-10-CM

## 2021-04-05 DIAGNOSIS — I10 Essential (primary) hypertension: Secondary | ICD-10-CM

## 2021-04-05 MED ORDER — AMLODIPINE BESYLATE 10 MG PO TABS: 10.0000 mg | ORAL_TABLET | Freq: Every day | ORAL | 1 refills | Status: DC

## 2021-04-05 MED ORDER — OMEPRAZOLE 20 MG PO CPDR: 20.0000 mg | DELAYED_RELEASE_CAPSULE | Freq: Every day | ORAL | 1 refills | Status: DC

## 2021-04-05 NOTE — Progress Notes (Signed)
? ?  S:    ? ?No chief complaint on file. ? ? ?Donald Cherry is a 46 y.o. male who presents for hypertension evaluation, education, and management. PMH is significant for Former Tob dep, elevated blood pressure, COPD, GERD. Patient was referred and last seen by Primary Care Provider, Dr. Wynetta Emery, on 03/07/2021. Amlodipine dose was increased at that visit.  ? ?Today, patient arrives in good spirits and presents without assistance. Denies dizziness, headache, blurred vision, swelling.  ? ?Patient reports hypertension is longstanding.  ? ?Family/Social history:  ?- Fhx: HTN, heart failure  ?- Tobacco: former smoker  ?- Alcohol: none reported  ? ?Medication adherence reported. Patient has taken BP medications today.  ? ?Current antihypertensives include: amlodipine 10 mg daily  ? ?Reported home BP readings:  ?- Reports 130s/80s especially after increasing amlodipine 10 mg daily. ? ?Patient reported dietary habits:  ?- Compliant with salt restriction  ?- Drinks soda rarely  ? ?Patient-reported exercise habits:  ?-Mainly active at home ?-Walks ~6 miles at work  ? ?O:  ?Vitals:  ? 04/05/21 1533  ?BP: 139/85  ? ?Last 3 Office BP readings: ?BP Readings from Last 3 Encounters:  ?04/05/21 139/85  ?03/07/21 (!) 144/91  ?01/15/21 (!) 171/106  ? ? ?BMET ?   ?Component Value Date/Time  ? NA 138 11/27/2020 1029  ? K 4.0 11/27/2020 1029  ? CL 106 11/27/2020 1029  ? CO2 25 11/27/2020 1029  ? GLUCOSE 124 (H) 11/27/2020 1029  ? BUN 11 11/27/2020 1029  ? CREATININE 0.70 11/27/2020 1029  ? CALCIUM 9.2 11/27/2020 1029  ? GFRNONAA >60 11/27/2020 1029  ? GFRAA >60 05/16/2016 1559  ? ? ?Renal function: ?CrCl cannot be calculated (Patient's most recent lab result is older than the maximum 21 days allowed.). ? ?Clinical ASCVD: No  ?The ASCVD Risk score (Arnett DK, et al., 2019) failed to calculate for the following reasons: ?  Cannot find a previous HDL lab ?  Cannot find a previous total cholesterol lab ? ?A/P: ?Hypertension longstanding  currently above goal but improving on current medications. BP goal < 130/80 mmHg. Medication adherence appears appropriate. Pt would prefer to continue with amlodipine and work on lifestyle optimization before adding additional medication at this time.   ?-Continued amlodipine 10 mg daily.  ?-Patient educated on purpose, proper use, and potential adverse effects of amlodipine.  ?-F/u labs ordered - none ?-Counseled on lifestyle modifications for blood pressure control including reduced dietary sodium, increased exercise, adequate sleep. ?-Encouraged patient to check BP at home and bring log of readings to next visit. Counseled on proper use of home BP cuff.  ? ?Results reviewed and written information provided. Patient verbalized understanding of treatment plan. Total time in face-to-face counseling 20 minutes.  ? ?F/u clinic visit with PCP. ? ?Benard Halsted, PharmD, BCACP, CPP ?Clinical Pharmacist ?[place] ?[phone] ? ? ?

## 2021-05-28 ENCOUNTER — Other Ambulatory Visit: Payer: Self-pay | Admitting: Internal Medicine

## 2021-05-28 DIAGNOSIS — K219 Gastro-esophageal reflux disease without esophagitis: Secondary | ICD-10-CM

## 2021-05-28 DIAGNOSIS — I10 Essential (primary) hypertension: Secondary | ICD-10-CM

## 2021-05-28 NOTE — Telephone Encounter (Signed)
Medication Refill - Medication:  amLODipine (NORVASC) 10 MG tablet  omeprazole (PRILOSEC) 20 MG capsule    Has the patient contacted their pharmacy? Yes.   Contact PCP *Needing both as a 90 day refill, for insurance*  Preferred Pharmacy (with phone number or street name):  [id number] [place] [place] Phone:  [phone]:  [phone] Has the patient been seen for an appointment in the last year OR does the patient have an upcoming appointment? Yes.    Agent: Please be advised that RX refills may take up to 3 business days. We ask that you follow-up with your pharmacy.

## 2021-05-29 MED ORDER — OMEPRAZOLE 20 MG PO CPDR: 20.0000 mg | DELAYED_RELEASE_CAPSULE | Freq: Every day | ORAL | 0 refills | Status: DC

## 2021-05-29 MED ORDER — AMLODIPINE BESYLATE 10 MG PO TABS: 10.0000 mg | ORAL_TABLET | Freq: Every day | ORAL | 0 refills | Status: DC
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
## 2021-07-12 ENCOUNTER — Encounter: Payer: Self-pay | Admitting: Internal Medicine

## 2021-07-12 ENCOUNTER — Ambulatory Visit: Payer: BC Managed Care – PPO | Attending: Internal Medicine | Admitting: Internal Medicine

## 2021-07-12 VITALS — BP 142/94 | HR 80 | Temp 98.8°F | Ht 75.0 in | Wt 161.0 lb

## 2021-07-12 DIAGNOSIS — D234 Other benign neoplasm of skin of scalp and neck: Secondary | ICD-10-CM | POA: Diagnosis not present

## 2021-07-12 DIAGNOSIS — I10 Essential (primary) hypertension: Secondary | ICD-10-CM

## 2021-07-12 DIAGNOSIS — R7989 Other specified abnormal findings of blood chemistry: Secondary | ICD-10-CM | POA: Diagnosis not present

## 2021-07-12 MED ORDER — VALSARTAN 40 MG PO TABS: 40.0000 mg | ORAL_TABLET | Freq: Every day | ORAL | 2 refills | Status: DC

## 2021-07-12 MED ORDER — AMLODIPINE BESYLATE 10 MG PO TABS: 10.0000 mg | ORAL_TABLET | Freq: Every day | ORAL | 2 refills | Status: DC

## 2021-07-12 NOTE — Patient Instructions (Signed)
After you have been on the new blood pressure medication for 1 to 2 weeks, please return to the lab to have your blood test done.
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
## 2021-07-26 ENCOUNTER — Ambulatory Visit: Payer: BC Managed Care – PPO | Attending: Internal Medicine

## 2021-07-26 DIAGNOSIS — R7989 Other specified abnormal findings of blood chemistry: Secondary | ICD-10-CM

## 2021-07-26 DIAGNOSIS — I10 Essential (primary) hypertension: Secondary | ICD-10-CM

## 2021-07-27 LAB — COMPREHENSIVE METABOLIC PANEL
ALT: 46 IU/L — ABNORMAL HIGH (ref 0–44)
AST: 57 IU/L — ABNORMAL HIGH (ref 0–40)
Albumin/Globulin Ratio: 1.8 (ref 1.2–2.2)
Albumin: 4.2 g/dL (ref 4.1–5.1)
Alkaline Phosphatase: 85 IU/L (ref 44–121)
BUN/Creatinine Ratio: 17 (ref 9–20)
BUN: 12 mg/dL (ref 6–24)
Bilirubin Total: <0.2 mg/dL (ref 0.0–1.2)
CO2: 22 mmol/L (ref 20–29)
Calcium: 9.2 mg/dL (ref 8.7–10.2)
Chloride: 104 mmol/L (ref 96–106)
Creatinine, Ser: 0.69 mg/dL — ABNORMAL LOW (ref 0.76–1.27)
Globulin, Total: 2.3 g/dL (ref 1.5–4.5)
Glucose: 96 mg/dL (ref 70–99)
Potassium: 3.6 mmol/L (ref 3.5–5.2)
Sodium: 140 mmol/L (ref 134–144)
Total Protein: 6.5 g/dL (ref 6.0–8.5)
eGFR: 116 mL/min/{1.73_m2} (ref >59)

## 2021-07-28 ENCOUNTER — Other Ambulatory Visit: Payer: Self-pay | Admitting: Internal Medicine

## 2021-07-28 DIAGNOSIS — R7989 Other specified abnormal findings of blood chemistry: Secondary | ICD-10-CM

## 2021-08-08 ENCOUNTER — Encounter: Payer: Self-pay | Admitting: Gastroenterology

## 2021-08-21 ENCOUNTER — Other Ambulatory Visit: Payer: Self-pay | Admitting: Internal Medicine

## 2021-08-21 DIAGNOSIS — K219 Gastro-esophageal reflux disease without esophagitis: Secondary | ICD-10-CM

## 2021-08-21 NOTE — Telephone Encounter (Signed)
Requested Prescriptions  Pending Prescriptions Disp Refills  . omeprazole (PRILOSEC) 20 MG capsule [Pharmacy Med Name: OMEPRAZOLE DR 20 MG CAPSULE] 90 capsule 0    Sig: TAKE 1 CAPSULE BY MOUTH EVERY DAY     Gastroenterology: Proton Pump Inhibitors Passed - 08/21/2021  9:30 AM      Passed - Valid encounter within last 12 months    Recent Outpatient Visits          1 month ago Essential hypertension   [place], Deborah B, MD   4 months ago Gastroesophageal reflux disease without esophagitis   [place], Jarome Matin, RPH-CPP   5 months ago Chronic bilateral low back pain without sciatica   [place], MD   7 months ago Acute bilateral low back pain without sciatica   [place], MD   8 months ago Chest pain, mid sternal   [place], MD      Future Appointments            In 2 months Wynetta Emery Dalbert Batman, MD [place]

## 2021-09-11 ENCOUNTER — Ambulatory Visit (INDEPENDENT_AMBULATORY_CARE_PROVIDER_SITE_OTHER): Payer: BC Managed Care – PPO | Admitting: Gastroenterology

## 2021-09-11 ENCOUNTER — Encounter: Payer: Self-pay | Admitting: Gastroenterology

## 2021-09-11 ENCOUNTER — Other Ambulatory Visit (INDEPENDENT_AMBULATORY_CARE_PROVIDER_SITE_OTHER): Payer: BC Managed Care – PPO

## 2021-09-11 VITALS — BP 140/90 | HR 94 | Ht 75.0 in | Wt 160.0 lb

## 2021-09-11 DIAGNOSIS — K219 Gastro-esophageal reflux disease without esophagitis: Secondary | ICD-10-CM

## 2021-09-11 DIAGNOSIS — R7401 Elevation of levels of liver transaminase levels: Secondary | ICD-10-CM

## 2021-09-11 LAB — IBC + FERRITIN
Ferritin: 408.3 ng/mL — ABNORMAL HIGH (ref 22.0–322.0)
Iron: 130 ug/dL (ref 42–165)
Saturation Ratios: 34.6 % (ref 20.0–50.0)
TIBC: 375.2 ug/dL (ref 250.0–450.0)
Transferrin: 268 mg/dL (ref 212.0–360.0)

## 2021-09-11 NOTE — Patient Instructions (Addendum)
_______________________________________________________  If you are age 47 or older, your body mass index should be between 23-30. Your Body mass index is 20 kg/m. If this is out of the aforementioned range listed, please consider follow up with your Primary Care Provider.  If you are age 46 or younger, your body mass index should be between 19-25. Your Body mass index is 20 kg/m. If this is out of the aformentioned range listed, please consider follow up with your Primary Care Provider.   ________________________________________________________  The [facility] providers would like to encourage you to use [facility] to communicate with providers for non-urgent requests or questions.  Due to long hold times on the telephone, sending your provider a message by [facility] may be a faster and more efficient way to get a response.  Please allow 48 business hours for a response.  Please remember that this is for non-urgent requests.  _______________________________________________________  Due to recent changes in healthcare laws, you may see the results of your imaging and laboratory studies on MyChart before your provider has had a chance to review them.  We understand that in some cases there may be results that are confusing or concerning to you. Not all laboratory results come back in the same time frame and the provider may be waiting for multiple results in order to interpret others.  Please give [place] 48 hours in order for your provider to thoroughly review all the results before contacting the office for clarification of your results.   Your provider has requested that you go to the basement level for lab work before leaving today. Press "B" on the elevator. The lab is located at the first door on the left as you exit the elevator.   Please follow up in 6 months. Give [place] a call at [phone] to schedule an appointment.   Thank you for choosing me and [facility].  Vito Cirigliano,  D.O.
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
## 2021-09-12 ENCOUNTER — Other Ambulatory Visit: Payer: BC Managed Care – PPO

## 2021-09-12 DIAGNOSIS — K219 Gastro-esophageal reflux disease without esophagitis: Secondary | ICD-10-CM

## 2021-09-12 DIAGNOSIS — R7401 Elevation of levels of liver transaminase levels: Secondary | ICD-10-CM

## 2021-09-13 LAB — IGA: Immunoglobulin A: 317 mg/dL — ABNORMAL HIGH (ref 47–310)

## 2021-09-14 LAB — IGM: IgM, Serum: 57 mg/dL (ref 50–300)

## 2021-09-14 LAB — ANTI-NUCLEAR AB-TITER (ANA TITER): ANA Titer 1: 1:40 {titer} — ABNORMAL HIGH

## 2021-09-14 LAB — MITOCHONDRIAL ANTIBODIES: Mitochondrial M2 Ab, IgG: <=20 U (ref <=20.0)

## 2021-09-14 LAB — HEPATITIS B SURFACE ANTIGEN: Hepatitis B Surface Ag: NONREACTIVE

## 2021-09-14 LAB — IGG: IgG (Immunoglobin G), Serum: 1159 mg/dL (ref 600–1640)

## 2021-09-14 LAB — TISSUE TRANSGLUTAMINASE, IGA: (tTG) Ab, IgA: <1 U/mL

## 2021-09-14 LAB — HEPATITIS A ANTIBODY, TOTAL: Hepatitis A AB,Total: NONREACTIVE

## 2021-09-14 LAB — ANTI-SMOOTH MUSCLE ANTIBODY, IGG: Actin (Smooth Muscle) Antibody (IGG): <20 U (ref <20)

## 2021-09-14 LAB — ANA: Anti Nuclear Antibody (ANA): POSITIVE — AB

## 2021-09-14 LAB — ALPHA-1-ANTITRYPSIN: A-1 Antitrypsin, Ser: 97 mg/dL (ref 83–199)

## 2021-09-14 LAB — CERULOPLASMIN: Ceruloplasmin: 33 mg/dL (ref 18–36)

## 2021-09-14 LAB — HEPATITIS B SURFACE ANTIBODY,QUALITATIVE: Hep B S Ab: NONREACTIVE

## 2021-09-17 ENCOUNTER — Other Ambulatory Visit: Payer: Self-pay

## 2021-09-17 ENCOUNTER — Other Ambulatory Visit: Payer: BC Managed Care – PPO

## 2021-09-17 DIAGNOSIS — R7401 Elevation of levels of liver transaminase levels: Secondary | ICD-10-CM

## 2021-09-17 DIAGNOSIS — R7402 Elevation of levels of lactic acid dehydrogenase (LDH): Secondary | ICD-10-CM

## 2021-09-17 DIAGNOSIS — Z23 Encounter for immunization: Secondary | ICD-10-CM

## 2021-09-17 NOTE — Addendum Note (Signed)
Addended by: Howell Pringle on: 09/17/2021 11:13 AM   Modules accepted: Orders

## 2021-09-18 ENCOUNTER — Other Ambulatory Visit: Payer: Self-pay

## 2021-09-18 ENCOUNTER — Ambulatory Visit (INDEPENDENT_AMBULATORY_CARE_PROVIDER_SITE_OTHER): Payer: BC Managed Care – PPO | Admitting: Gastroenterology

## 2021-09-18 ENCOUNTER — Other Ambulatory Visit: Payer: BC Managed Care – PPO

## 2021-09-18 DIAGNOSIS — Z23 Encounter for immunization: Secondary | ICD-10-CM

## 2021-09-18 DIAGNOSIS — R7401 Elevation of levels of liver transaminase levels: Secondary | ICD-10-CM

## 2021-09-20 LAB — NASH FIBROSURE(R) PLUS
ALPHA 2-MACROGLOBULINS, QN: 136 mg/dL (ref 110–276)
ALT (SGPT) P5P: 51 IU/L (ref 0–55)
AST (SGOT) P5P: 49 IU/L — ABNORMAL HIGH (ref 0–40)
Apolipoprotein A-1: 195 mg/dL — ABNORMAL HIGH (ref 101–178)
Bilirubin, Total: 0.3 mg/dL (ref 0.0–1.2)
Cholesterol, Total: 164 mg/dL (ref 100–199)
Fibrosis Score: 0.09 (ref 0.00–0.21)
GGT: 220 IU/L — ABNORMAL HIGH (ref 0–65)
Glucose: 94 mg/dL (ref 70–99)
Haptoglobin: 164 mg/dL (ref 23–355)
NASH Score: 0.75 — ABNORMAL HIGH (ref 0.00–0.25)
Steatosis Score: 0.7 — ABNORMAL HIGH (ref 0.00–0.40)
Triglycerides: 316 mg/dL — ABNORMAL HIGH (ref 0–149)

## 2021-09-24 ENCOUNTER — Other Ambulatory Visit: Payer: Self-pay

## 2021-09-24 DIAGNOSIS — K7581 Nonalcoholic steatohepatitis (NASH): Secondary | ICD-10-CM

## 2021-10-03 ENCOUNTER — Ambulatory Visit (HOSPITAL_COMMUNITY)
Admission: RE | Admit: 2021-10-03 | Discharge: 2021-10-03 | Disposition: A | Discharge status: Home / Self Care | Payer: BC Managed Care – PPO | Source: Ambulatory Visit | Attending: Gastroenterology | Admitting: Gastroenterology

## 2021-10-03 DIAGNOSIS — K7581 Nonalcoholic steatohepatitis (NASH): Secondary | ICD-10-CM | POA: Diagnosis not present

## 2021-10-19 ENCOUNTER — Ambulatory Visit (INDEPENDENT_AMBULATORY_CARE_PROVIDER_SITE_OTHER): Payer: BC Managed Care – PPO | Admitting: Gastroenterology

## 2021-10-19 DIAGNOSIS — Z23 Encounter for immunization: Secondary | ICD-10-CM

## 2021-11-13 ENCOUNTER — Ambulatory Visit: Payer: BC Managed Care – PPO | Attending: Internal Medicine | Admitting: Internal Medicine

## 2021-11-13 ENCOUNTER — Encounter: Payer: Self-pay | Admitting: Internal Medicine

## 2021-11-13 VITALS — BP 132/80 | HR 67 | Temp 98.5°F | Ht 75.0 in | Wt 155.0 lb

## 2021-11-13 DIAGNOSIS — Z2821 Immunization not carried out because of patient refusal: Secondary | ICD-10-CM | POA: Diagnosis not present

## 2021-11-13 DIAGNOSIS — I10 Essential (primary) hypertension: Secondary | ICD-10-CM

## 2021-11-13 DIAGNOSIS — D234 Other benign neoplasm of skin of scalp and neck: Secondary | ICD-10-CM | POA: Diagnosis not present

## 2021-11-13 DIAGNOSIS — K760 Fatty (change of) liver, not elsewhere classified: Secondary | ICD-10-CM | POA: Diagnosis not present

## 2021-11-13 NOTE — Progress Notes (Signed)
Patient ID: Donald Cherry, male    DOB: Apr 09, 1975  MRN: [id number]  CC: Hypertension (HTN f/u. No questions / concerns./No to flu vax.)   Subjective: Donald Cherry is a 45 y.o. male who presents for chronic ds management His concerns today include:  Former Tob dep, elevated blood pressure, COPD, GERD  HYPERTENSION Currently taking: see medication list Med Adherence: '[x]'$  Yes    '[]'$  No Medication side effects: '[]'$  Yes    '[x]'$  No Adherence with salt restriction: '[x]'$  Yes - he tries but admits he sometimes uses salt in foods   '[]'$  No Home Monitoring?: '[x]'$  Yes    '[]'$  No Monitoring Frequency: 2x/wk Home BP results range: 130s/70-85 SOB? '[]'$  Yes    '[x]'$  No Chest Pain?: '[]'$  Yes    '[x]'$  No Leg swelling?: '[]'$  Yes    '[x]'$  No Headaches?: '[]'$  Yes    '[x]'$  No Dizziness? '[]'$  Yes    '[x]'$  No Comments:   Abnormal LFTS: AST and ALT remained elevated.  Patient was referred to gastroenterology.  Saw Dr. Bryan Lemma and had full work-up done.  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  Patient states that he is trying to change his eating habits.  I referred him to dermatology for dermoid cyst on RT forehead but pt states he was never called.  Patient Active Problem List   Diagnosis Date Noted   Elevated LFTs 01/16/2021   Acute bilateral low back pain without sciatica 12/16/2020   Essential hypertension 12/16/2020   Gastroesophageal reflux disease without esophagitis 12/04/2020   Aortic atherosclerosis ([place]) 12/04/2020   Elevated blood pressure reading in office without diagnosis of hypertension 12/04/2020   History of tobacco abuse 12/04/2020   Pulmonary emphysema ([place]) 12/04/2020   BMI 20.0-20.9, adult 12/04/2020     Current Outpatient Medications on File Prior to Visit  Medication Sig Dispense Refill   amLODipine (NORVASC) 10 MG tablet Take 1 tablet (10 mg total) by mouth daily. 90 tablet 2   methocarbamol (ROBAXIN) 500 MG tablet Take 1 tablet (500 mg total) by mouth 2 (two) times daily as needed for muscle spasms. 30  tablet 0   Multiple Vitamin (MULTIVITAMIN) capsule Take 1 capsule by mouth daily.     omeprazole (PRILOSEC) 20 MG capsule TAKE 1 CAPSULE BY MOUTH EVERY DAY 90 capsule 1   valsartan (DIOVAN) 40 MG tablet Take 1 tablet (40 mg total) by mouth daily. 90 tablet 2   No current facility-administered medications on file prior to visit.    Allergies  Allergen Reactions   Milk-Related Compounds Diarrhea    Lactose intolerance    Social History   Socioeconomic History   Marital status: Single    Spouse name: Not on file   Number of children: 6   Years of education: Not on file   Highest education level: Not on file  Occupational History   Occupation: Conservator, museum/gallery  Tobacco Use   Smoking status: Some Days    Types: Cigarettes   Smokeless tobacco: Never  Vaping Use   Vaping Use: Never used  Substance and Sexual Activity   Alcohol use: Yes    Alcohol/week: 2.0 standard drinks of alcohol    Types: 2 Cans of beer per week   Drug use: No   Sexual activity: Not Currently  Other Topics Concern   Not on file  Social History Narrative   Not on file   Social Determinants of Health   Financial Resource Strain: Not on file  Food Insecurity: Not on file  Transportation Needs: Not on file  Physical Activity: Not on file  Stress: Not on file  Social Connections: Not on file  Intimate Partner Violence: Not on file    Family History  Problem Relation Age of Onset   Heart failure Mother    Diabetes Father    High blood pressure Father    Prostate cancer Father    Prostate cancer Maternal Grandfather    Stomach cancer Neg Hx    Esophageal cancer Neg Hx    Liver cancer Neg Hx     No past surgical history on file.  ROS: Review of Systems Negative except as stated above  PHYSICAL EXAM: BP 132/80   Pulse 67   Temp 98.5 F (36.9 C) (Oral)   Ht '6\' 3"'$  (1.905 m)   Wt 155 lb (70.3 kg)   SpO2 100%   BMI 19.37 kg/m   Physical Exam   General appearance - alert, well  appearing, middle age AAM  and in no distress Mental status - normal mood, behavior, speech, dress, motor activity, and thought processes Neck - supple, no significant adenopathy Chest - clear to auscultation, no wheezes, rales or rhonchi, symmetric air entry Heart - normal rate, regular rhythm, normal S1, S2, no murmurs, rubs, clicks or gallops Extremities - peripheral pulses normal, no pedal edema, no clubbing or cyanosis Skin: He is noted to have a soft movable raised cystic area on the right forehead close to the hairline.  It has a blackhead in the center.     Latest Ref Rng & Units 07/26/2021    3:20 PM 01/15/2021    4:26 PM 11/27/2020   10:29 AM  CMP  Glucose 70 - 99 mg/dL 96   124   BUN 6 - 24 mg/dL 12   11   Creatinine 0.76 - 1.27 mg/dL 0.69   0.70   Sodium 134 - 144 mmol/L 140   138   Potassium 3.5 - 5.2 mmol/L 3.6   4.0   Chloride 96 - 106 mmol/L 104   106   CO2 20 - 29 mmol/L 22   25   Calcium 8.7 - 10.2 mg/dL 9.2   9.2   Total Protein 6.0 - 8.5 g/dL 6.5  7.0  8.0   Total Bilirubin 0.0 - 1.2 mg/dL <0.2  0.5  0.8   Alkaline Phos 44 - 121 IU/L 85  79  75   AST 0 - 40 IU/L 57  47  42   ALT 0 - 44 IU/L 46  45  47    Lipid Panel     Component Value Date/Time   CHOL 164 09/18/2021 1521   TRIG 316 (H) 09/18/2021 1521    CBC    Component Value Date/Time   WBC 7.7 11/27/2020 1029   RBC 5.10 11/27/2020 1029   HGB 15.1 11/27/2020 1029   HCT 44.7 11/27/2020 1029   PLT 283 11/27/2020 1029   MCV 87.6 11/27/2020 1029   MCH 29.6 11/27/2020 1029   MCHC 33.8 11/27/2020 1029   RDW 12.1 11/27/2020 1029   LYMPHSABS 2.4 11/27/2020 1029   MONOABS 0.6 11/27/2020 1029   EOSABS 0.1 11/27/2020 1029   BASOSABS 0.0 11/27/2020 1029    ASSESSMENT AND PLAN: 1. Essential hypertension Close to goal.  Continue amlodipine 10 mg daily and Diovan 40 mg daily.  2. Fatty liver Encourage healthy eating habits.  3. Influenza vaccination declined   4. Dermoid cyst of scalp - Ambulatory  referral to Dermatology Message  sent to our referral coordinator    Patient was given the opportunity to ask questions.  Patient verbalized understanding of the plan and was able to repeat key elements of the plan.   This documentation was completed using Radio producer.  Any transcriptional errors are unintentional.  Orders Placed This Encounter  Procedures   Ambulatory referral to Dermatology     Requested Prescriptions    No prescriptions requested or ordered in this encounter    Return in about 4 months (around 03/14/2022).  Karle Plumber, MD, FACP

## 2021-12-12 ENCOUNTER — Telehealth: Payer: Self-pay | Admitting: Internal Medicine

## 2021-12-12 NOTE — Telephone Encounter (Signed)
-----   Message from Ena Dawley sent at 12/11/2021  3:27 PM EST ----- Regarding: Dermatology  Update Estill Bamberg  from [facility]  respond    Mr Garms-cancelled 11/29 appt and did not reschedule  ----- Message ----- From: Ladell Pier, MD Sent: 11/13/2021   5:37 PM EST To: Ena Dawley  This patient was referred to dermatology back in July.  He never received a call about it.  I have resubmitted the referral.

## 2022-02-15 ENCOUNTER — Other Ambulatory Visit: Payer: Self-pay | Admitting: Internal Medicine

## 2022-02-15 DIAGNOSIS — K219 Gastro-esophageal reflux disease without esophagitis: Secondary | ICD-10-CM

## 2022-02-15 NOTE — Telephone Encounter (Signed)
Requested Prescriptions  Pending Prescriptions Disp Refills   omeprazole (PRILOSEC) 20 MG capsule [Pharmacy Med Name: OMEPRAZOLE DR 20 MG CAPSULE] 90 capsule 0    Sig: TAKE 1 CAPSULE BY MOUTH EVERY DAY     Gastroenterology: Proton Pump Inhibitors Passed - 02/15/2022  4:22 AM      Passed - Valid encounter within last 12 months    Recent Outpatient Visits           3 months ago Essential hypertension   [place], MD   7 months ago Essential hypertension   [place], MD   10 months ago Gastroesophageal reflux disease without esophagitis   [place], RPH-CPP   11 months ago Chronic bilateral low back pain without sciatica   [place] Ladell Pier, MD   1 year ago Acute bilateral low back pain without sciatica   [place], MD       Future Appointments             In 4 weeks Ladell Pier, MD [place]

## 2022-03-15 ENCOUNTER — Encounter: Payer: Self-pay | Admitting: Internal Medicine

## 2022-03-15 ENCOUNTER — Ambulatory Visit: Payer: BC Managed Care – PPO | Attending: Internal Medicine | Admitting: Internal Medicine

## 2022-03-15 VITALS — BP 126/78 | HR 77 | Temp 98.6°F | Ht 75.0 in | Wt 153.0 lb

## 2022-03-15 DIAGNOSIS — K219 Gastro-esophageal reflux disease without esophagitis: Secondary | ICD-10-CM

## 2022-03-15 DIAGNOSIS — J438 Other emphysema: Secondary | ICD-10-CM | POA: Diagnosis not present

## 2022-03-15 DIAGNOSIS — I700 Atherosclerosis of aorta: Secondary | ICD-10-CM | POA: Diagnosis not present

## 2022-03-15 DIAGNOSIS — I10 Essential (primary) hypertension: Secondary | ICD-10-CM

## 2022-03-15 DIAGNOSIS — K7581 Nonalcoholic steatohepatitis (NASH): Secondary | ICD-10-CM

## 2022-03-15 MED ORDER — AMLODIPINE BESYLATE 10 MG PO TABS: 10.0000 mg | ORAL_TABLET | Freq: Every day | ORAL | 2 refills | Status: DC

## 2022-03-15 MED ORDER — OMEPRAZOLE 20 MG PO CPDR: 20.0000 mg | DELAYED_RELEASE_CAPSULE | Freq: Every day | ORAL | 1 refills | Status: DC

## 2022-03-15 MED ORDER — VALSARTAN 40 MG PO TABS: 40.0000 mg | ORAL_TABLET | Freq: Every day | ORAL | 2 refills | Status: DC

## 2022-03-15 NOTE — Progress Notes (Signed)
Patient ID: Donald Cherry, male    DOB: 03/27/1975  MRN: [id number]  CC: Hypertension (Htn f/u. Med refill. /No questions /concerns. /No to flu vax.)   Subjective: Donald Cherry is a 47 y.o. male who presents for chronic ds management His concerns today include:  Former Tob dep, elevated blood pressure, COPD, GERD, NASH   HTN: Reports compliance with amlodipine 10 mg and valsartan 40 mg daily.  He limits salt in the foods.  No chest pains or shortness of breath.  No lower extremity edema.  Since last visit, patient states that he did get in with a dermatologist but not with [facility] dermatology.  Lesion was removed from the forehead.  It was not cancerous.  Since last visit, he reports smoking a cigar every now and then.  CAT scan done in 2022 revealed some emphysematous changes in the lungs.  Incidental finding at that time was also aortic atherosclerosis. Reports he is still doing well with his eating habits given his diagnosis of Donald Cherry.  Patient Active Problem List   Diagnosis Date Noted   Elevated LFTs 01/16/2021   Acute bilateral low back pain without sciatica 12/16/2020   Essential hypertension 12/16/2020   Gastroesophageal reflux disease without esophagitis 12/04/2020   Aortic atherosclerosis ([place]) 12/04/2020   Elevated blood pressure reading in office without diagnosis of hypertension 12/04/2020   History of tobacco abuse 12/04/2020   Pulmonary emphysema ([place]) 12/04/2020   BMI 20.0-20.9, adult 12/04/2020     Current Outpatient Medications on File Prior to Visit  Medication Sig Dispense Refill   amLODipine (NORVASC) 10 MG tablet Take 1 tablet (10 mg total) by mouth daily. 90 tablet 2   methocarbamol (ROBAXIN) 500 MG tablet Take 1 tablet (500 mg total) by mouth 2 (two) times daily as needed for muscle spasms. 30 tablet 0   Multiple Vitamin (MULTIVITAMIN) capsule Take 1 capsule by mouth daily.     omeprazole (PRILOSEC) 20 MG capsule TAKE 1 CAPSULE BY MOUTH EVERY DAY  90 capsule 0   valsartan (DIOVAN) 40 MG tablet Take 1 tablet (40 mg total) by mouth daily. 90 tablet 2   No current facility-administered medications on file prior to visit.    Allergies  Allergen Reactions   Milk-Related Compounds Diarrhea    Lactose intolerance    Social History   Socioeconomic History   Marital status: Single    Spouse name: Not on file   Number of children: 6   Years of education: Not on file   Highest education level: Not on file  Occupational History   Occupation: Conservator, museum/gallery  Tobacco Use   Smoking status: Some Days    Types: Cigarettes   Smokeless tobacco: Never  Vaping Use   Vaping Use: Never used  Substance and Sexual Activity   Alcohol use: Yes    Alcohol/week: 2.0 standard drinks of alcohol    Types: 2 Cans of beer per week   Drug use: No   Sexual activity: Not Currently  Other Topics Concern   Not on file  Social History Narrative   Not on file   Social Determinants of Health   Financial Resource Strain: Not on file  Food Insecurity: Not on file  Transportation Needs: Not on file  Physical Activity: Not on file  Stress: Not on file  Social Connections: Not on file  Intimate Partner Violence: Not on file    Family History  Problem Relation Age of Onset   Heart failure Mother  Diabetes Father    High blood pressure Father    Prostate cancer Father    Prostate cancer Maternal Grandfather    Stomach cancer Neg Hx    Esophageal cancer Neg Hx    Liver cancer Neg Hx     No past surgical history on file.  ROS: Review of Systems Negative except as stated above  PHYSICAL EXAM: BP 126/78 (BP Location: Left Arm, Patient Position: Sitting, Cuff Size: Normal)   Pulse 77   Temp 98.6 F (37 C) (Oral)   Ht '6\' 3"'$  (1.905 m)   Wt 153 lb (69.4 kg)   SpO2 99%   BMI 19.12 kg/m   Physical Exam   General appearance - alert, well appearing, middle age AAM and in no distress Mental status - normal mood, behavior, speech, dress,  motor activity, and thought processes Chest - clear to auscultation, no wheezes, rales or rhonchi, symmetric air entry Heart - normal rate, regular rhythm, normal S1, S2, no murmurs, rubs, clicks or gallops Extremities - peripheral pulses normal, no pedal edema, no clubbing or cyanosis     Latest Ref Rng & Units 07/26/2021    3:20 PM 01/15/2021    4:26 PM 11/27/2020   10:29 AM  CMP  Glucose 70 - 99 mg/dL 96   124   BUN 6 - 24 mg/dL 12   11   Creatinine 0.76 - 1.27 mg/dL 0.69   0.70   Sodium 134 - 144 mmol/L 140   138   Potassium 3.5 - 5.2 mmol/L 3.6   4.0   Chloride 96 - 106 mmol/L 104   106   CO2 20 - 29 mmol/L 22   25   Calcium 8.7 - 10.2 mg/dL 9.2   9.2   Total Protein 6.0 - 8.5 g/dL 6.5  7.0  8.0   Total Bilirubin 0.0 - 1.2 mg/dL <0.2  0.5  0.8   Alkaline Phos 44 - 121 IU/L 85  79  75   AST 0 - 40 IU/L 57  47  42   ALT 0 - 44 IU/L 46  45  47    Lipid Panel     Component Value Date/Time   CHOL 164 09/18/2021 1521   TRIG 316 (H) 09/18/2021 1521    CBC    Component Value Date/Time   WBC 7.7 11/27/2020 1029   RBC 5.10 11/27/2020 1029   HGB 15.1 11/27/2020 1029   HCT 44.7 11/27/2020 1029   PLT 283 11/27/2020 1029   MCV 87.6 11/27/2020 1029   MCH 29.6 11/27/2020 1029   MCHC 33.8 11/27/2020 1029   RDW 12.1 11/27/2020 1029   LYMPHSABS 2.4 11/27/2020 1029   MONOABS 0.6 11/27/2020 1029   EOSABS 0.1 11/27/2020 1029   BASOSABS 0.0 11/27/2020 1029    ASSESSMENT AND PLAN: 1. Essential hypertension At goal.  Continue amlodipine and valsartan - amLODipine (NORVASC) 10 MG tablet; Take 1 tablet (10 mg total) by mouth daily.  Dispense: 90 tablet; Refill: 2 - valsartan (DIOVAN) 40 MG tablet; Take 1 tablet (40 mg total) by mouth daily.  Dispense: 90 tablet; Refill: 2  2. Gastroesophageal reflux disease without esophagitis Refill given on omeprazole. - omeprazole (PRILOSEC) 20 MG capsule; Take 1 capsule (20 mg total) by mouth daily.  Dispense: 90 capsule; Refill: 1  3. Aortic  atherosclerosis ([place]) Encouraged him to continue healthy eating habits. - Lipid panel  4. Paraseptal emphysema ([place]) Strongly advised that he stay away from cigarettes and cigars given mild  changes of emphysema seen on CAT scan 2 years ago.  5. NASH (nonalcoholic steatohepatitis) - Hepatic Function Panel    Patient was given the opportunity to ask questions.  Patient verbalized understanding of the plan and was able to repeat key elements of the plan.   This documentation was completed using Radio producer.  Any transcriptional errors are unintentional.  No orders of the defined types were placed in this encounter.    Requested Prescriptions    No prescriptions requested or ordered in this encounter    No follow-ups on file.  Karle Plumber, MD, FACP

## 2022-03-16 LAB — HEPATIC FUNCTION PANEL
ALT: 48 IU/L — ABNORMAL HIGH (ref 0–44)
AST: 52 IU/L — ABNORMAL HIGH (ref 0–40)
Albumin: 4.6 g/dL (ref 4.1–5.1)
Alkaline Phosphatase: 87 IU/L (ref 44–121)
Bilirubin Total: 0.3 mg/dL (ref 0.0–1.2)
Bilirubin, Direct: 0.14 mg/dL (ref 0.00–0.40)
Total Protein: 7.2 g/dL (ref 6.0–8.5)

## 2022-03-16 LAB — LIPID PANEL
Chol/HDL Ratio: 2.7 ratio (ref 0.0–5.0)
Cholesterol, Total: 155 mg/dL (ref 100–199)
HDL: 58 mg/dL (ref >39)
LDL Chol Calc (NIH): 56 mg/dL (ref 0–99)
Triglycerides: 260 mg/dL — ABNORMAL HIGH (ref 0–149)
VLDL Cholesterol Cal: 41 mg/dL — ABNORMAL HIGH (ref 5–40)

## 2022-03-21 ENCOUNTER — Ambulatory Visit (INDEPENDENT_AMBULATORY_CARE_PROVIDER_SITE_OTHER): Payer: BC Managed Care – PPO | Admitting: Gastroenterology

## 2022-03-21 DIAGNOSIS — Z23 Encounter for immunization: Secondary | ICD-10-CM
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
## 2022-09-17 ENCOUNTER — Encounter: Payer: Self-pay | Admitting: Internal Medicine

## 2022-09-17 ENCOUNTER — Ambulatory Visit: Payer: BC Managed Care – PPO | Attending: Internal Medicine | Admitting: Internal Medicine

## 2022-09-17 VITALS — BP 123/77 | HR 85 | Temp 98.4°F | Ht 75.0 in | Wt 149.0 lb

## 2022-09-17 DIAGNOSIS — Z2821 Immunization not carried out because of patient refusal: Secondary | ICD-10-CM | POA: Diagnosis not present

## 2022-09-17 DIAGNOSIS — Z5320 Procedure and treatment not carried out because of patient's decision for unspecified reasons: Secondary | ICD-10-CM

## 2022-09-17 DIAGNOSIS — D649 Anemia, unspecified: Secondary | ICD-10-CM

## 2022-09-17 DIAGNOSIS — R634 Abnormal weight loss: Secondary | ICD-10-CM

## 2022-09-17 DIAGNOSIS — I10 Essential (primary) hypertension: Secondary | ICD-10-CM | POA: Diagnosis not present

## 2022-09-17 DIAGNOSIS — Z8042 Family history of malignant neoplasm of prostate: Secondary | ICD-10-CM

## 2022-09-17 NOTE — Progress Notes (Addendum)
Patient ID: Donald Cherry, male    DOB: 08-12-1975  MRN: [id number]  CC: Hypertension (HTN f/u./No questions / concerns /No to flu vax)   Subjective: Donald Cherry is a 47 y.o. male who presents for chronic ds management. His concerns today include:  Former Tob dep, elevated blood pressure, COPD and aortic sclerosis seen on CAT scan 2022, GERD, NASH   HTN: Reports compliance with amlodipine 10 mg and valsartan 40 mg daily. He limits salt in the foods. No chest pains or shortness of breath. No lower extremity edema.   I note today when I saw him that he has had wgh loss. however patient does not feel that he has lost weight.  In looking through his weight chart, he was 165 pounds in March of last year.  He has steadily lost weight since then.  Today he is 149 pounds.   He tells me that he does not eat as much especially at work because the environment is very hot.  Works in a Futures trader.  He may have a breakfast sandwich with some juice for breakfast, may eat a sandwich and some chips for lunch but usually has a good meal for supper.  Denies any issues with depression. No blood in stools.  Up-to-date with colon cancer screening. Passing his urine okay with no blood in the urine.  Fhx of prostate CA in father and uncles No feeling of being hot all the times.  No palpitations.     No polyuria or polydipsia.  Patient Active Problem List   Diagnosis Date Noted   Elevated LFTs 01/16/2021   Acute bilateral low back pain without sciatica 12/16/2020   Essential hypertension 12/16/2020   Gastroesophageal reflux disease without esophagitis 12/04/2020   Aortic atherosclerosis (HCC) 12/04/2020   Elevated blood pressure reading in office without diagnosis of hypertension 12/04/2020   History of tobacco abuse 12/04/2020   Pulmonary emphysema (HCC) 12/04/2020   BMI 20.0-20.9, adult 12/04/2020     Current Outpatient Medications on File Prior to Visit  Medication Sig  Dispense Refill   amLODipine (NORVASC) 10 MG tablet Take 1 tablet (10 mg total) by mouth daily. 90 tablet 2   methocarbamol (ROBAXIN) 500 MG tablet Take 1 tablet (500 mg total) by mouth 2 (two) times daily as needed for muscle spasms. 30 tablet 0   Multiple Vitamin (MULTIVITAMIN) capsule Take 1 capsule by mouth daily.     valsartan (DIOVAN) 40 MG tablet Take 1 tablet (40 mg total) by mouth daily. 90 tablet 2   No current facility-administered medications on file prior to visit.    Allergies  Allergen Reactions   Milk-Related Compounds Diarrhea    Lactose intolerance    Social History   Socioeconomic History   Marital status: Single    Spouse name: Not on file   Number of children: 6   Years of education: Not on file   Highest education level: Not on file  Occupational History   Occupation: Therapist, art  Tobacco Use   Smoking status: Some Days    Types: Cigarettes   Smokeless tobacco: Never  Vaping Use   Vaping status: Never Used  Substance and Sexual Activity   Alcohol use: Yes    Alcohol/week: 2.0 standard drinks of alcohol    Types: 2 Cans of beer per week   Drug use: No   Sexual activity: Not Currently  Other Topics Concern   Not on file  Social History Narrative  Not on file   Social Determinants of Health   Financial Resource Strain: Not on file  Food Insecurity: Not on file  Transportation Needs: Not on file  Physical Activity: Not on file  Stress: Not on file  Social Connections: Not on file  Intimate Partner Violence: Not on file    Family History  Problem Relation Age of Onset   Heart failure Mother    Diabetes Father    High blood pressure Father    Prostate cancer Father    Prostate cancer Maternal Grandfather    Stomach cancer Neg Hx    Esophageal cancer Neg Hx    Liver cancer Neg Hx     No past surgical history on file.  ROS: Review of Systems Negative except as stated above  PHYSICAL EXAM: BP 123/77 (BP Location: Left Arm,  Patient Position: Sitting, Cuff Size: Normal)   Pulse 85   Temp 98.4 F (36.9 C) (Oral)   Ht 6\' 3"  (1.905 m)   Wt 149 lb (67.6 kg)   SpO2 98%   BMI 18.62 kg/m   Wt Readings from Last 3 Encounters:  09/17/22 149 lb (67.6 kg)  03/15/22 153 lb (69.4 kg)  11/13/21 155 lb (70.3 kg)    Physical Exam   General appearance - alert, middle-aged African-American male who appears underweight for height.  Some temporal wasting noted.   Mental status - normal mood, behavior, speech, dress, motor activity, and thought processes Chest - clear to auscultation, no wheezes, rales or rhonchi, symmetric air entry Heart - normal rate, regular rhythm, normal S1, S2, no murmurs, rubs, clicks or gallops Extremities - peripheral pulses normal, no pedal edema, no clubbing or cyanosis     Latest Ref Rng & Units 03/15/2022    4:24 PM 07/26/2021    3:20 PM 01/15/2021    4:26 PM  CMP  Glucose 70 - 99 mg/dL  96    BUN 6 - 24 mg/dL  12    Creatinine 1.61 - 1.27 mg/dL  0.96    Sodium 045 - 409 mmol/L  140    Potassium 3.5 - 5.2 mmol/L  3.6    Chloride 96 - 106 mmol/L  104    CO2 20 - 29 mmol/L  22    Calcium 8.7 - 10.2 mg/dL  9.2    Total Protein 6.0 - 8.5 g/dL 7.2  6.5  7.0   Total Bilirubin 0.0 - 1.2 mg/dL 0.3  <8.1  0.5   Alkaline Phos 44 - 121 IU/L 87  85  79   AST 0 - 40 IU/L 52  57  47   ALT 0 - 44 IU/L 48  46  45    Lipid Panel     Component Value Date/Time   CHOL 155 03/15/2022 1624   CHOL 164 09/18/2021 1521   TRIG 260 (H) 03/15/2022 1624   TRIG 316 (H) 09/18/2021 1521   HDL 58 03/15/2022 1624   CHOLHDL 2.7 03/15/2022 1624   LDLCALC 56 03/15/2022 1624    CBC    Component Value Date/Time   WBC 7.7 11/27/2020 1029   RBC 5.10 11/27/2020 1029   HGB 15.1 11/27/2020 1029   HCT 44.7 11/27/2020 1029   PLT 283 11/27/2020 1029   MCV 87.6 11/27/2020 1029   MCH 29.6 11/27/2020 1029   MCHC 33.8 11/27/2020 1029   RDW 12.1 11/27/2020 1029   LYMPHSABS 2.4 11/27/2020 1029   MONOABS 0.6  11/27/2020 1029   EOSABS 0.1 11/27/2020  1029   BASOSABS 0.0 11/27/2020 1029    ASSESSMENT AND PLAN: 1. Essential hypertension At goal.  Continue amlodipine 10 mg daily and Diovan 40 mg daily - CBC; Future - Basic metabolic panel; Future  2. Weight loss, unintentional Patient is not very concerned about this stating that he tends to eat more once the weather cools down.  I suggest checking thyroid function test and doing an HIV screening test.  Patient declined the HIV test stating that he has been with the same male partner for quite a number of years. - TSH+T4F+T3Free; Future  3. Influenza vaccination declined   4. HIV screening declined   5. Family history of prostate cancer -Will check PSA level given his family history.  Last PSA checked was over a year ago. - PSA; Future   Addendum: 10/01/22:  CBC with new normocytic anemia.  Will add iron studies.  Patient was given the opportunity to ask questions.  Patient verbalized understanding of the plan and was able to repeat key elements of the plan.   This documentation was completed using Paediatric nurse.  Any transcriptional errors are unintentional.  Orders Placed This Encounter  Procedures   CBC   Basic metabolic panel   [id number]   PSA     Requested Prescriptions    No prescriptions requested or ordered in this encounter    Return in about 6 months (around 03/17/2023).  Jonah Blue, MD, FACP

## 2022-09-30 ENCOUNTER — Ambulatory Visit: Payer: BC Managed Care – PPO | Attending: Internal Medicine

## 2022-09-30 DIAGNOSIS — R634 Abnormal weight loss: Secondary | ICD-10-CM

## 2022-09-30 DIAGNOSIS — I10 Essential (primary) hypertension: Secondary | ICD-10-CM

## 2022-09-30 DIAGNOSIS — Z8042 Family history of malignant neoplasm of prostate: Secondary | ICD-10-CM

## 2022-10-01 LAB — CBC
Hematocrit: 37.8 % (ref 37.5–51.0)
Hemoglobin: 12.5 g/dL — ABNORMAL LOW (ref 13.0–17.7)
MCH: 30.3 pg (ref 26.6–33.0)
MCHC: 33.1 g/dL (ref 31.5–35.7)
MCV: 92 fL (ref 79–97)
Platelets: 227 10*3/uL (ref 150–450)
RBC: 4.13 x10E6/uL — ABNORMAL LOW (ref 4.14–5.80)
RDW: 12 % (ref 11.6–15.4)
WBC: 7.7 10*3/uL (ref 3.4–10.8)

## 2022-10-01 LAB — BASIC METABOLIC PANEL
BUN/Creatinine Ratio: 19 (ref 9–20)
BUN: 12 mg/dL (ref 6–24)
CO2: 22 mmol/L (ref 20–29)
Calcium: 9.7 mg/dL (ref 8.7–10.2)
Chloride: 99 mmol/L (ref 96–106)
Creatinine, Ser: 0.62 mg/dL — ABNORMAL LOW (ref 0.76–1.27)
Glucose: 92 mg/dL (ref 70–99)
Potassium: 4.1 mmol/L (ref 3.5–5.2)
Sodium: 138 mmol/L (ref 134–144)
eGFR: 118 mL/min/{1.73_m2} (ref >59)

## 2022-10-01 LAB — TSH+T4F+T3FREE
Free T4: 1.15 ng/dL (ref 0.82–1.77)
T3, Free: 4 pg/mL (ref 2.0–4.4)
TSH: 2.63 u[IU]/mL (ref 0.450–4.500)

## 2022-10-01 LAB — PSA: Prostate Specific Ag, Serum: 1.3 ng/mL (ref 0.0–4.0)

## 2022-10-01 NOTE — Addendum Note (Signed)
Addended by: Jonah Blue B on: 10/01/2022 10:38 AM   Modules accepted: Orders

## 2022-10-06 ENCOUNTER — Telehealth: Payer: Self-pay | Admitting: Internal Medicine

## 2022-10-06 DIAGNOSIS — I10 Essential (primary) hypertension: Secondary | ICD-10-CM

## 2022-10-06 DIAGNOSIS — D649 Anemia, unspecified: Secondary | ICD-10-CM

## 2022-10-06 DIAGNOSIS — R7989 Other specified abnormal findings of blood chemistry: Secondary | ICD-10-CM

## 2022-10-06 NOTE — Telephone Encounter (Signed)
Let pt know that his thyroid level was normal. PSA, which is the screening test for prostate cancer was normal. Slight anemia.  Iron studies revealed iron level good but elevated iron stores.  I would like to refer him to a hematologist to evaluate further.  Please let me know if he agrees to referral so I can submit it.

## 2022-10-07 NOTE — Telephone Encounter (Signed)
Called but no answer. LVM to call back.  

## 2022-10-08 NOTE — Telephone Encounter (Signed)
Called and spoke to patient. Patient stated that he will call back due to being on the other line with the pharmacy.

## 2022-10-09 ENCOUNTER — Telehealth: Payer: Self-pay

## 2022-10-09 MED ORDER — VALSARTAN 40 MG PO TABS: 40.0000 mg | ORAL_TABLET | Freq: Every day | ORAL | 2 refills | Status: DC

## 2022-10-09 MED ORDER — AMLODIPINE BESYLATE 10 MG PO TABS: 10.0000 mg | ORAL_TABLET | Freq: Every day | ORAL | 2 refills | Status: DC

## 2022-10-09 NOTE — Telephone Encounter (Signed)
Referral submitted to hematology.

## 2022-10-09 NOTE — Telephone Encounter (Signed)
Copied from CRM [id number]. Topic: General - Other >> Oct 08, 2022  5:13 PM Everette C wrote: Reason for CRM: The patient has returned missed phone call from the practice regarding their recent labs  Please contact again when possible

## 2022-10-16 ENCOUNTER — Telehealth: Payer: Self-pay | Admitting: Internal Medicine

## 2022-10-16 NOTE — Telephone Encounter (Signed)
-----   Message from Randa Lynn sent at 10/15/2022  1:33 PM EDT ----- Regarding: Referral Closed Good afternoon,  Third and final attempt - Called and lvm for call back to schedule a new heme appointment. I explained that this was the third and final attempt and referral would be closed. However, I did say that if they changed their mind and would like to be seen, I would be more than happy to schedule.  Thank you  Bjorn Loser

## 2022-10-16 NOTE — Telephone Encounter (Signed)
Let patient know that the hematologist office has tried calling him 3 times schedule an appointment.  They tell me that he has not replied.  Please encourage him to call them back so that the appointment can be scheduled.

## 2022-10-16 NOTE — Telephone Encounter (Signed)
Called but no answer. LVM to call back.  

## 2022-10-17 NOTE — Telephone Encounter (Signed)
Patient called back and states he spoke with the Cancer [facility] at [facility] today 10.10.24 in regards to scheduling and they told him they did not have an appointment until November 6th at 1:15pm and he states he can not do that because he works until 3 and it would have to be after 3pm. Patient states the Cancer Center told him they had another provider but she was on maternity leave right now.

## 2022-10-17 NOTE — Telephone Encounter (Signed)
Called but no answer. LVM to call back.  

## 2022-10-19 LAB — IRON,TIBC AND FERRITIN PANEL
Ferritin: 925 ng/mL — ABNORMAL HIGH (ref 30–400)
Iron Saturation: 36 % (ref 15–55)
Iron: 113 ug/dL (ref 38–169)
Total Iron Binding Capacity: 314 ug/dL (ref 250–450)
UIBC: 201 ug/dL (ref 111–343)

## 2022-10-19 LAB — SPECIMEN STATUS REPORT

## 2022-10-25 ENCOUNTER — Telehealth: Payer: Self-pay | Admitting: Oncology

## 2022-10-25 NOTE — Telephone Encounter (Signed)
Unable to make contact with the patient due to mailbox not being set up.

## 2022-10-30 ENCOUNTER — Encounter: Payer: Self-pay | Admitting: *Deleted

## 2022-10-30 NOTE — Progress Notes (Signed)
Call from [facility] requesting appointment for anemia. Have already tried [facility] office, but they have no openings that fit his schedule. He must be seen after 3 pm. Informed caller that new patient appointments at this location are 12:00 and 1:40 pm. She will f/u with patient.

## 2022-11-22 ENCOUNTER — Other Ambulatory Visit: Payer: Self-pay | Admitting: Internal Medicine

## 2022-11-22 ENCOUNTER — Telehealth: Payer: Self-pay | Admitting: Medical Oncology

## 2022-11-22 ENCOUNTER — Telehealth: Payer: Self-pay | Admitting: Internal Medicine

## 2022-11-22 ENCOUNTER — Other Ambulatory Visit: Payer: Self-pay | Admitting: Medical Oncology

## 2022-11-22 DIAGNOSIS — D649 Anemia, unspecified: Secondary | ICD-10-CM

## 2022-11-22 DIAGNOSIS — D508 Other iron deficiency anemias: Secondary | ICD-10-CM

## 2022-11-22 NOTE — Telephone Encounter (Signed)
Called to inform the patient that labs are needed before his MD visit tomorrow 11/16. Informed the patient to arrive at 1030 instead of 11. Left VM with appointment details.

## 2022-11-22 NOTE — Telephone Encounter (Signed)
LVM to return call to confirm appt tomorrow.

## 2022-11-23 ENCOUNTER — Inpatient Hospital Stay: Payer: BC Managed Care – PPO | Attending: Internal Medicine | Admitting: Internal Medicine

## 2022-11-23 ENCOUNTER — Inpatient Hospital Stay: Payer: BC Managed Care – PPO

## 2022-11-23 VITALS — BP 130/83 | HR 81 | Temp 97.2°F | Resp 20 | Wt 145.6 lb

## 2022-11-23 DIAGNOSIS — D649 Anemia, unspecified: Secondary | ICD-10-CM | POA: Diagnosis not present

## 2022-11-23 DIAGNOSIS — Z8249 Family history of ischemic heart disease and other diseases of the circulatory system: Secondary | ICD-10-CM | POA: Diagnosis not present

## 2022-11-23 DIAGNOSIS — R634 Abnormal weight loss: Secondary | ICD-10-CM | POA: Diagnosis not present

## 2022-11-23 DIAGNOSIS — R197 Diarrhea, unspecified: Secondary | ICD-10-CM | POA: Insufficient documentation

## 2022-11-23 DIAGNOSIS — D508 Other iron deficiency anemias: Secondary | ICD-10-CM

## 2022-11-23 DIAGNOSIS — K219 Gastro-esophageal reflux disease without esophagitis: Secondary | ICD-10-CM | POA: Diagnosis not present

## 2022-11-23 DIAGNOSIS — I10 Essential (primary) hypertension: Secondary | ICD-10-CM | POA: Insufficient documentation

## 2022-11-23 LAB — CMP (CANCER CENTER ONLY)
ALT: 39 U/L (ref 10–47)
AST: 33 U/L (ref 15–41)
Albumin: 4.5 g/dL (ref 3.5–5.0)
Alkaline Phosphatase: 72 U/L (ref 38–126)
Anion gap: 7 (ref 5–15)
BUN: 11 mg/dL (ref 6–20)
CO2: 25 mmol/L (ref 22–32)
Calcium: 9.3 mg/dL (ref 8.9–10.3)
Chloride: 108 mmol/L (ref 98–111)
Creatinine: 0.75 mg/dL (ref 0.60–1.20)
GFR, Estimated: >60 mL/min (ref >60)
Glucose, Bld: 93 mg/dL (ref 70–99)
Potassium: 3.6 mmol/L (ref 3.5–5.1)
Sodium: 140 mmol/L (ref 135–145)
Total Bilirubin: 0.5 mg/dL (ref <1.2)
Total Protein: 7.6 g/dL (ref 6.5–8.1)

## 2022-11-23 LAB — CBC WITH DIFFERENTIAL (CANCER CENTER ONLY)
Abs Immature Granulocytes: 0.01 10*3/uL (ref 0.00–0.07)
Basophils Absolute: 0 10*3/uL (ref 0.0–0.1)
Basophils Relative: 0 %
Eosinophils Absolute: 0.2 10*3/uL (ref 0.0–0.5)
Eosinophils Relative: 3 %
HCT: 37.1 % — ABNORMAL LOW (ref 39.0–52.0)
Hemoglobin: 12.7 g/dL — ABNORMAL LOW (ref 13.0–17.0)
Immature Granulocytes: 0 %
Lymphocytes Relative: 37 %
Lymphs Abs: 1.9 10*3/uL (ref 0.7–4.0)
MCH: 30.3 pg (ref 26.0–34.0)
MCHC: 34.2 g/dL (ref 30.0–36.0)
MCV: 88.5 fL (ref 80.0–100.0)
Monocytes Absolute: 0.5 10*3/uL (ref 0.1–1.0)
Monocytes Relative: 10 %
Neutro Abs: 2.6 10*3/uL (ref 1.7–7.7)
Neutrophils Relative %: 50 %
Platelet Count: 283 10*3/uL (ref 150–400)
RBC: 4.19 MIL/uL — ABNORMAL LOW (ref 4.22–5.81)
RDW: 12 % (ref 11.5–15.5)
WBC Count: 5.2 10*3/uL (ref 4.0–10.5)
nRBC: 0 % (ref 0.0–0.2)

## 2022-11-23 LAB — IRON AND IRON BINDING CAPACITY (CC-WL,HP ONLY)
Iron: 131 ug/dL (ref 45–182)
Saturation Ratios: 33 % (ref 17.9–39.5)
TIBC: 399 ug/dL (ref 250–450)
UIBC: 268 ug/dL (ref 117–376)

## 2022-11-23 LAB — FERRITIN: Ferritin: 563 ng/mL — ABNORMAL HIGH (ref 24–336)

## 2022-11-23 LAB — LACTATE DEHYDROGENASE: LDH: 181 U/L (ref 98–192)

## 2022-11-23 LAB — FOLATE: Folate: 6.2 ng/mL (ref >5.9)

## 2022-11-23 LAB — VITAMIN B12: Vitamin B-12: 298 pg/mL (ref 180–914)

## 2022-11-23 NOTE — Progress Notes (Signed)
[facility] [phone]   [phone]  CONSULT NOTE  REFERRING PHYSICIAN: Dr. Jonah Blue  REASON FOR CONSULTATION:  47 years old African-American male with mild anemia  HPI Donald Cherry is a 47 y.o. male. Discussed the use of AI scribe software for clinical note transcription with the patient, who gave verbal consent to proceed.  History of Present Illness   The patient, a 47 year old individual with a history of GERD and hypertension, was referred for evaluation of anemia detected during routine blood work by his primary care physician. The patient reported no specific complaints or changes in health status at the time of the blood work. However, he did mention experiencing intermittent diarrhea over the past two months, which was a new symptom for him. He denied any blood in the stool, and a colonoscopy performed approximately a year and a half ago was reportedly normal.  The patient also reported an unintentional weight loss of approximately 10 pounds over the past six months, despite not actively trying to lose weight. He denied any other new symptoms, including fatigue, dizziness, chest pain, shortness of breath, cough, nausea, vomiting, abdominal pain, or changes in vision.  The patient has a past medical history significant for acid reflux and hypertension. He has a family history of congestive heart failure in his mother and diabetes, hypertension, and prostate cancer in his father. The patient is a former smoker, having quit approximately two years ago after a 28-year smoking history. He reported occasional alcohol consumption but denied any drug abuse.  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 He reported taking a daily multivitamin supplement.        Past Medical History:  Diagnosis Date   Acid reflux 01/2021   High blood pressure 01/2021    No past surgical history on file.  Family History   Problem Relation Age of Onset   Heart failure Mother    Diabetes Father    High blood pressure Father    Prostate cancer Father    Prostate cancer Maternal Grandfather    Stomach cancer Neg Hx    Esophageal cancer Neg Hx    Liver cancer Neg Hx     Social History Social History   Tobacco Use   Smoking status: Some Days    Types: Cigarettes   Smokeless tobacco: Never  Vaping Use   Vaping status: Never Used  Substance Use Topics   Alcohol use: Yes    Alcohol/week: 2.0 standard drinks of alcohol    Types: 2 Cans of beer per week   Drug use: No    Allergies  Allergen Reactions   Milk-Related Compounds Diarrhea    Lactose intolerance    Current Outpatient Medications  Medication Sig Dispense Refill   amLODipine (NORVASC) 10 MG tablet Take 1 tablet (10 mg total) by mouth daily. 90 tablet 2   methocarbamol (ROBAXIN) 500 MG tablet Take 1 tablet (500 mg total) by mouth 2 (two) times daily as needed for muscle spasms. 30 tablet 0   Multiple Vitamin (MULTIVITAMIN) capsule Take 1 capsule by mouth daily.     valsartan (DIOVAN) 40 MG tablet Take 1 tablet (40 mg total) by mouth daily. 90 tablet 2   No current facility-administered medications for this visit.    Review of Systems  Constitutional: positive for fatigue Eyes: negative Ears, nose, mouth, throat, and face: negative Respiratory: negative Cardiovascular: negative Gastrointestinal: negative Genitourinary:negative Integument/breast: negative Hematologic/lymphatic: negative Musculoskeletal:negative Neurological:  negative Behavioral/Psych: negative Endocrine: negative Allergic/Immunologic: negative  Physical Exam  ZOX:WRUEA, healthy, no distress, well nourished, and well developed SKIN: skin color, texture, turgor are normal, no rashes or significant lesions HEAD: Normocephalic, No masses, lesions, tenderness or abnormalities EYES: normal, PERRLA, Conjunctiva are pink and non-injected EARS: External ears  normal, Canals clear OROPHARYNX:no exudate, no erythema, and lips, buccal mucosa, and tongue normal  NECK: supple, no adenopathy, no JVD LYMPH:  no palpable lymphadenopathy, no hepatosplenomegaly LUNGS: clear to auscultation , and palpation HEART: regular rate & rhythm, no murmurs, and no gallops ABDOMEN:abdomen soft, non-tender, normal bowel sounds, and no masses or organomegaly BACK: Back symmetric, no curvature., No CVA tenderness EXTREMITIES:no joint deformities, effusion, or inflammation, no edema  NEURO: alert & oriented x 3 with fluent speech, no focal motor/sensory deficits  PERFORMANCE STATUS: ECOG 1  LABORATORY DATA: Lab Results  Component Value Date   WBC 5.2 11/23/2022   HGB 12.7 (L) 11/23/2022   HCT 37.1 (L) 11/23/2022   MCV 88.5 11/23/2022   PLT 283 11/23/2022      Chemistry      Component Value Date/Time   NA 138 09/30/2022 1521   K 4.1 09/30/2022 1521   CL 99 09/30/2022 1521   CO2 22 09/30/2022 1521   BUN 12 09/30/2022 1521   CREATININE 0.62 (L) 09/30/2022 1521   GLU 94 09/18/2021 1521      Component Value Date/Time   CALCIUM 9.7 09/30/2022 1521   ALKPHOS 87 03/15/2022 1624   AST 52 (H) 03/15/2022 1624   ALT 48 (H) 03/15/2022 1624   BILITOT 0.3 03/15/2022 1624       RADIOGRAPHIC STUDIES: No results found.  ASSESSMENT AND PLAN:    Anemia Mild anemia with hemoglobin of 12.7 g/dL and hematocrit of 54.0%. Previous hemoglobin was 12.5 g/dL in September 2024 and 15 g/dL in 9811. Iron studies normal, but ferritin elevated. No signs of infection, inflammation, or gastrointestinal bleeding. Differential diagnosis includes vitamin B12 deficiency, folic acid deficiency, or other factors. Current anemia is not severe. Further tests needed to determine the cause. Continuing multivitamins. - Continue one-a-day multivitamin - Order blood tests for vitamin B12 and folic acid deficiency - Repeat blood work in 2-3 months - Schedule follow-up appointment in 2-3  months  Diarrhea Intermittent diarrhea without constipation, blood in stool, or other gastrointestinal symptoms. No recent changes in health status or new complaints. No signs of irritable bowel syndrome or other gastrointestinal conditions. - Monitor symptoms and report any changes or worsening  Hypertension Well-managed hypertension. No current complaints or symptoms. - Continue current management and monitoring of blood pressure  Gastroesophageal Reflux Disease (GERD) Well-managed GERD. No current complaints or symptoms. - Continue current management and monitoring of symptoms  General Health Maintenance Routine health maintenance discussed. No new screenings or vaccinations mentioned. - Continue routine health maintenance and follow-up with primary care provider as needed  Follow-up - Schedule follow-up appointment in 2-3 months - Repeat blood work in 2-3 months.   The patient was advised to call immediately if he has any other concerning symptoms in the interval. The patient voices understanding of current disease status and treatment options and is in agreement with the current care plan.  All questions were answered. The patient knows to call the clinic with any problems, questions or concerns. We can certainly see the patient much sooner if necessary.  Thank you so much for allowing me to participate in the care of Donald Cherry. I will continue to follow  up the patient with you and assist in his care.  The total time spent in the appointment was 60 minutes.  Disclaimer: This note was dictated with voice recognition software. Similar sounding words can inadvertently be transcribed and may not be corrected upon review.   Lajuana Matte November 23, 2022, 11:04 AM

## 2022-11-28 LAB — PROTEIN ELECTROPHORESIS, SERUM, WITH REFLEX
A/G Ratio: 1.4 (ref 0.7–1.7)
Albumin ELP: 4.1 g/dL (ref 2.9–4.4)
Alpha-1-Globulin: 0.2 g/dL (ref 0.0–0.4)
Alpha-2-Globulin: 0.8 g/dL (ref 0.4–1.0)
Beta Globulin: 1.1 g/dL (ref 0.7–1.3)
Gamma Globulin: 0.9 g/dL (ref 0.4–1.8)
Globulin, Total: 3 g/dL (ref 2.2–3.9)
Total Protein ELP: 7.1 g/dL (ref 6.0–8.5)

## 2023-01-23 ENCOUNTER — Inpatient Hospital Stay: Payer: BC Managed Care – PPO | Attending: Internal Medicine

## 2023-01-23 ENCOUNTER — Inpatient Hospital Stay (HOSPITAL_BASED_OUTPATIENT_CLINIC_OR_DEPARTMENT_OTHER): Payer: BC Managed Care – PPO | Admitting: Internal Medicine

## 2023-01-23 DIAGNOSIS — R7989 Other specified abnormal findings of blood chemistry: Secondary | ICD-10-CM | POA: Diagnosis present

## 2023-01-23 DIAGNOSIS — D649 Anemia, unspecified: Secondary | ICD-10-CM | POA: Diagnosis present

## 2023-01-23 LAB — CBC WITH DIFFERENTIAL (CANCER CENTER ONLY)
Abs Immature Granulocytes: 0.02 10*3/uL (ref 0.00–0.07)
Basophils Absolute: 0 10*3/uL (ref 0.0–0.1)
Basophils Relative: 0 %
Eosinophils Absolute: 0.3 10*3/uL (ref 0.0–0.5)
Eosinophils Relative: 4 %
HCT: 38.6 % — ABNORMAL LOW (ref 39.0–52.0)
Hemoglobin: 13.1 g/dL (ref 13.0–17.0)
Immature Granulocytes: 0 %
Lymphocytes Relative: 30 %
Lymphs Abs: 2.1 10*3/uL (ref 0.7–4.0)
MCH: 30 pg (ref 26.0–34.0)
MCHC: 33.9 g/dL (ref 30.0–36.0)
MCV: 88.5 fL (ref 80.0–100.0)
Monocytes Absolute: 0.6 10*3/uL (ref 0.1–1.0)
Monocytes Relative: 8 %
Neutro Abs: 4.1 10*3/uL (ref 1.7–7.7)
Neutrophils Relative %: 58 %
Platelet Count: 385 10*3/uL (ref 150–400)
RBC: 4.36 MIL/uL (ref 4.22–5.81)
RDW: 12.1 % (ref 11.5–15.5)
WBC Count: 7.1 10*3/uL (ref 4.0–10.5)
nRBC: 0 % (ref 0.0–0.2)

## 2023-01-23 LAB — IRON AND IRON BINDING CAPACITY (CC-WL,HP ONLY)
Iron: 195 ug/dL — ABNORMAL HIGH (ref 45–182)
Saturation Ratios: 46 % — ABNORMAL HIGH (ref 17.9–39.5)
TIBC: 424 ug/dL (ref 250–450)
UIBC: 229 ug/dL (ref 117–376)

## 2023-01-23 LAB — FERRITIN: Ferritin: 324 ng/mL (ref 24–336)

## 2023-01-23 NOTE — Progress Notes (Signed)
[facility] [phone]   [phone]  OFFICE PROGRESS NOTE  Marcine Matar, MD [address] [place] [address]  DIAGNOSIS: Mild anemia suspicious for anemia of chronic disease/iron deficiency.  Resolved  PRIOR THERAPY: None  CURRENT THERAPY: Multivitamin 1 tablet p.o. daily  INTERVAL HISTORY: Donald Cherry 48 y.o. male returns to the clinic today for follow-up visit.Discussed the use of AI scribe software for clinical note transcription with the patient, who gave verbal consent to proceed.  History of Present Illness   The 49 year old patient was last seen two months ago for anemia and elevated ferritin levels. Despite extensive blood work, including iron studies, B12, folic acid, and serum protein electrophoresis, the only abnormality was the elevated ferritin level. The patient reports no change in his health status since the last visit. He continues to take a daily multivitamin, the iron content of which is unknown.  The patient's hemoglobin levels have shown a gradual increase over the past few months, from 12.5 in September to 12.7 in November, and currently at 13.1, which is within the normal range.      MEDICAL HISTORY: Past Medical History:  Diagnosis Date   Acid reflux 01/2021   High blood pressure 01/2021    ALLERGIES:  is allergic to milk-related compounds.  MEDICATIONS:  Current Outpatient Medications  Medication Sig Dispense Refill   amLODipine (NORVASC) 10 MG tablet Take 1 tablet (10 mg total) by mouth daily. 90 tablet 2   methocarbamol (ROBAXIN) 500 MG tablet Take 1 tablet (500 mg total) by mouth 2 (two) times daily as needed for muscle spasms. 30 tablet 0   Multiple Vitamin (MULTIVITAMIN) capsule Take 1 capsule by mouth daily.     valsartan (DIOVAN) 40 MG tablet Take 1 tablet (40 mg total) by mouth daily. 90 tablet 2   No current facility-administered medications for this visit.    SURGICAL  HISTORY: No past surgical history on file.  REVIEW OF SYSTEMS:  A comprehensive review of systems was negative.   PHYSICAL EXAMINATION: General appearance: alert, cooperative, and no distress Head: Normocephalic, without obvious abnormality, atraumatic Neck: no adenopathy, no JVD, supple, symmetrical, trachea midline, and thyroid not enlarged, symmetric, no tenderness/mass/nodules Lymph nodes: Cervical, supraclavicular, and axillary nodes normal. Resp: clear to auscultation bilaterally Back: symmetric, no curvature. ROM normal. No CVA tenderness. Cardio: regular rate and rhythm, S1, S2 normal, no murmur, click, rub or gallop GI: soft, non-tender; bowel sounds normal; no masses,  no organomegaly Extremities: extremities normal, atraumatic, no cyanosis or edema  ECOG PERFORMANCE STATUS: 0 - Asymptomatic  Blood pressure 116/83, pulse 75, temperature 98.6 F (37 C), temperature source Temporal, resp. rate 17, weight 148 lb 14.4 oz (67.5 kg), SpO2 100%.  LABORATORY DATA: Lab Results  Component Value Date   WBC 7.1 01/23/2023   HGB 13.1 01/23/2023   HCT 38.6 (L) 01/23/2023   MCV 88.5 01/23/2023   PLT 385 01/23/2023      Chemistry      Component Value Date/Time   NA 140 11/23/2022 1029   NA 138 09/30/2022 1521   K 3.6 11/23/2022 1029   CL 108 11/23/2022 1029   CO2 25 11/23/2022 1029   BUN 11 11/23/2022 1029   BUN 12 09/30/2022 1521   CREATININE 0.75 11/23/2022 1029   GLU 94 09/18/2021 1521      Component Value Date/Time   CALCIUM 9.3 11/23/2022 1029   ALKPHOS 72 11/23/2022 1029  AST 33 11/23/2022 1029   ALT 39 11/23/2022 1029   BILITOT 0.5 11/23/2022 1029       RADIOGRAPHIC STUDIES: No results found.  ASSESSMENT AND PLAN:     Elevated Ferritin Level Persistent elevated ferritin level since November. Iron studies, B12, folic acid, and serum protein electrophoresis were normal. Hemoglobin improved from 12.5 in September to 13.1, within normal range. Likely due to  multivitamin intake or inflammation. No further hematologic follow-up necessary unless new symptoms arise. - Discontinue regular hematology follow-up - Follow up with family doctor - Monitor for any concerning symptoms and call if needed  General Health Maintenance Taking a men's one-a-day multivitamin. No specific concerns discussed. - Continue taking multivitamin once daily.   The patient was advised to call immediately if he has any other concerning symptoms in the interval. The patient voices understanding of current disease status and treatment options and is in agreement with the current care plan.  All questions were answered. The patient knows to call the clinic with any problems, questions or concerns. We can certainly see the patient much sooner if necessary. The total time spent in the appointment was 20 minutes.  Disclaimer: This note was dictated with voice recognition software. Similar sounding words can inadvertently be transcribed and may not be corrected upon review.

## 2023-01-24 ENCOUNTER — Ambulatory Visit: Payer: BC Managed Care – PPO | Admitting: Internal Medicine

## 2023-01-24 ENCOUNTER — Other Ambulatory Visit: Payer: BC Managed Care – PPO

## 2023-03-14 ENCOUNTER — Telehealth: Payer: Self-pay | Admitting: Internal Medicine

## 2023-03-14 NOTE — Telephone Encounter (Signed)
 Contacted pt confirm appt!

## 2023-03-18 ENCOUNTER — Encounter: Payer: Self-pay | Admitting: Internal Medicine

## 2023-03-18 ENCOUNTER — Ambulatory Visit: Payer: BC Managed Care – PPO | Attending: Internal Medicine | Admitting: Internal Medicine

## 2023-03-18 VITALS — BP 118/77 | HR 82 | Temp 98.0°F | Ht 75.0 in | Wt 148.0 lb

## 2023-03-18 DIAGNOSIS — I10 Essential (primary) hypertension: Secondary | ICD-10-CM

## 2023-03-18 DIAGNOSIS — F17210 Nicotine dependence, cigarettes, uncomplicated: Secondary | ICD-10-CM

## 2023-03-18 DIAGNOSIS — R7989 Other specified abnormal findings of blood chemistry: Secondary | ICD-10-CM

## 2023-03-18 NOTE — Progress Notes (Signed)
 Patient ID: Donald Cherry, male    DOB: Mar 08, 1975  MRN: [id number]  CC: Hypertension (HTN f/u./No questions / concerns/No to pneumonia vax)   Subjective: Donald Cherry is a 48 y.o. male who presents for chronic ds management. His concerns today include:  Former Tob dep, elevated blood pressure, COPD and aortic sclerosis seen on CAT scan 2022, GERD, NASH   HTN: Reports compliance with taking Norvasc 10 mg daily and Diovan 40 mg daily.  He tries to limit salt in the foods.  No chest pains, shortness of breath or lower extremity edema. Tobacco use: States that he smokes 1 cigarette "every blue moon."  Does not smoke every day. Has seen oncologist Dr. Arbutus Ped in regards to the normocytic anemia.  Iron studies that showed elevated ferritin of 925.  Level has since returned to normal.  Told that it may have been due to the multivitamin that he was taking or due to inflammation.  On last visit I was concerned about weight loss.  Weight has since remained stable.  Reports that he is eating normally.  No abdominal pains.  Moving his bowels okay.  No blood in the stools. 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 90 tablet 2   methocarbamol (ROBAXIN) 500 MG tablet Take 1 tablet (500 mg total) by mouth 2 (two) times daily as needed for muscle spasms. 30  tablet 0   Multiple Vitamin (MULTIVITAMIN) capsule Take 1 capsule by mouth daily.     valsartan (DIOVAN) 40 MG tablet Take 1 tablet (40 mg total) by mouth daily. 90 tablet 2   No current facility-administered medications on file prior to visit.    Allergies  Allergen Reactions   Milk-Related Compounds Diarrhea    Lactose intolerance    Social History   Socioeconomic History   Marital status: Single    Spouse name: Not on file   Number of children: 6   Years of education: Not on file   Highest education level: Not on file  Occupational History   Occupation: Therapist, art  Tobacco Use   Smoking status: Some Days    Types: Cigarettes   Smokeless tobacco: Never  Vaping Use   Vaping status: Never Used  Substance and Sexual Activity   Alcohol use: Yes    Alcohol/week: 2.0 standard drinks of alcohol    Types: 2 Cans of beer per week   Drug use: No   Sexual activity: Not Currently  Other Topics Concern   Not on file  Social History Narrative   Not on file   Social Drivers of Health   Financial Resource Strain: Low Risk  (03/18/2023)   Overall Financial Resource Strain (CARDIA)    Difficulty of Paying Living Expenses: Not hard at all  Food Insecurity: No Food Insecurity (03/18/2023)   Hunger Vital Sign    Worried About Running Out of  Food in the Last Year: Never true    Ran Out of Food in the Last Year: Never true  Transportation Needs: No Transportation Needs (03/18/2023)   PRAPARE - Administrator, Civil Service (Medical): No    Lack of Transportation (Non-Medical): No  Physical Activity: Insufficiently Active (03/18/2023)   Exercise Vital Sign    Days of Exercise per Week: 2 days    Minutes of Exercise per Session: 30 min  Stress: No Stress Concern Present (03/18/2023)   [employer] of Occupational Health - Occupational Stress Questionnaire    Feeling of Stress : Not at all  Social Connections: Moderately Integrated (03/18/2023)   Social Connection  and Isolation Panel [NHANES]    Frequency of Communication with Friends and Family: More than three times a week    Frequency of Social Gatherings with Friends and Family: Twice a week    Attends Religious Services: More than 4 times per year    Active Member of [employer] or Organizations: No    Attends Banker Meetings: Never    Marital Status: Living with partner  Intimate Partner Violence: Not At Risk (03/18/2023)   Humiliation, Afraid, Rape, and Kick questionnaire    Fear of Current or Ex-Partner: No    Emotionally Abused: No    Physically Abused: No    Sexually Abused: No    Family History  Problem Relation Age of Onset   Heart failure Mother    Diabetes Father    High blood pressure Father    Prostate cancer Father    Prostate cancer Maternal Grandfather    Stomach cancer Neg Hx    Esophageal cancer Neg Hx    Liver cancer Neg Hx     No past surgical history on file.  ROS: Review of Systems Negative except as stated above  PHYSICAL EXAM: BP 118/77 (BP Location: Left Arm, Patient Position: Sitting, Cuff Size: Normal)   Pulse 82   Temp 98 F (36.7 C) (Oral)   Ht 6\' 3"  (1.905 m)   Wt 148 lb (67.1 kg)   SpO2 99%   BMI 18.50 kg/m   Wt Readings from Last 3 Encounters:  03/18/23 148 lb (67.1 kg)  01/23/23 148 lb 14.4 oz (67.5 kg)  11/23/22 145 lb 9.6 oz (66 kg)    Physical Exam   General appearance - alert, middle-age African-American male who appears underweight for height and in no distress Mental status - normal mood, behavior, speech, dress, motor activity, and thought processes Chest - clear to auscultation, no wheezes, rales or rhonchi, symmetric air entry Heart - normal rate, regular rhythm, normal S1, S2, no murmurs, rubs, clicks or gallops Extremities - peripheral pulses normal, no pedal edema, no clubbing or cyanosis     Latest Ref Rng & Units 11/23/2022   10:29 AM 09/30/2022    3:21 PM 03/15/2022    4:24 PM  CMP  Glucose 70 - 99 mg/dL 93   92    BUN 6 - 20 mg/dL 11  12    Creatinine 1.61 - 1.20 mg/dL 0.96  0.45    Sodium 409 - 145 mmol/L 140  138    Potassium 3.5 - 5.1 mmol/L 3.6  4.1    Chloride 98 - 111 mmol/L 108  99    CO2 22 - 32 mmol/L 25  22    Calcium 8.9 - 10.3 mg/dL 9.3  9.7    Total Protein 6.5 - 8.1 g/dL 7.6   7.2  Total Bilirubin <1.2 mg/dL 0.5   0.3   Alkaline Phos 38 - 126 U/L 72   87   AST 15 - 41 U/L 33   52   ALT 10 - 47 U/L 39   48    Lipid Panel     Component Value Date/Time   CHOL 155 03/15/2022 1624   CHOL 164 09/18/2021 1521   TRIG 260 (H) 03/15/2022 1624   TRIG 316 (H) 09/18/2021 1521   HDL 58 03/15/2022 1624   CHOLHDL 2.7 03/15/2022 1624   LDLCALC 56 03/15/2022 1624    CBC    Component Value Date/Time   WBC 7.1 01/23/2023 1055   WBC 7.7 11/27/2020 1029   RBC 4.36 01/23/2023 1055   HGB 13.1 01/23/2023 1055   HGB 12.5 (L) 09/30/2022 1521   HCT 38.6 (L) 01/23/2023 1055   HCT 37.8 09/30/2022 1521   PLT 385 01/23/2023 1055   PLT 227 09/30/2022 1521   MCV 88.5 01/23/2023 1055   MCV 92 09/30/2022 1521   MCH 30.0 01/23/2023 1055   MCHC 33.9 01/23/2023 1055   RDW 12.1 01/23/2023 1055   RDW 12.0 09/30/2022 1521   LYMPHSABS 2.1 01/23/2023 1055   MONOABS 0.6 01/23/2023 1055   EOSABS 0.3 01/23/2023 1055   BASOSABS 0.0 01/23/2023 1055    ASSESSMENT AND PLAN: 1. Essential hypertension (Primary) At goal.  Continue Norvasc 10 mg daily and Diovan 40 mg daily.  2. Elevated ferritin level This has since normalized.  3. Light smoker Strongly advised to quit smoking completely.  He states that he plans to do so.    Patient was given the opportunity to ask questions.  Patient verbalized understanding of the plan and was able to repeat key elements of the plan.   This documentation was completed using Paediatric nurse.  Any transcriptional errors are unintentional.  No orders of the defined types were placed in this encounter.    Requested Prescriptions    No  prescriptions requested or ordered in this encounter    Return in about 6 months (around 09/18/2023) for for physical.  Jonah Blue, MD, Donald Cherry

## 2023-07-10 ENCOUNTER — Ambulatory Visit
Admission: EM | Admit: 2023-07-10 | Discharge: 2023-07-10 | Disposition: A | Discharge status: Home / Self Care | Source: Ambulatory Visit | Attending: Physician Assistant | Admitting: Physician Assistant

## 2023-07-10 ENCOUNTER — Ambulatory Visit: Admitting: Radiology

## 2023-07-10 DIAGNOSIS — S93402A Sprain of unspecified ligament of left ankle, initial encounter: Secondary | ICD-10-CM

## 2023-07-10 MED ORDER — IBUPROFEN 800 MG PO TABS
800.0000 mg | ORAL_TABLET | Freq: Once | ORAL | Status: AC
  Administered 2023-07-10: 800 mg via ORAL

## 2023-07-10 NOTE — ED Provider Notes (Signed)
 [facility]    CSN: [id number] Arrival date & time: 07/10/23  1204      History   Chief Complaint No chief complaint on file.   HPI Donald Cherry is a 48 y.o. male.   HPI  Pt presents today with concerns for left ankle injury  He states he was running to get inside while mowing the lawn and stepped in a hole causing his ankle and foot to twist ( reports supination )  He states this occurred the night before last and states the swelling has improved a bit since then  He reports pain radiating up the back of the leg  He denies numbness or tingling  Pain level: currently 4/10, standing and bearing weight is 8/10  Interventions: ice pack and elevating the ankle   Past Medical History:  Diagnosis Date   Acid reflux 01/2021   High blood pressure 01/2021    Patient Active Problem List   Diagnosis Date Noted   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 No pertinent surgical history.     Home Medications    Prior to Admission medications   Medication Sig Start Date End Date Taking? Authorizing Provider  amLODipine  (NORVASC ) 10 MG tablet Take 1 tablet (10 mg total) by mouth daily. 10/09/22  Yes Vicci Barnie NOVAK, MD  Multiple Vitamin (MULTIVITAMIN) capsule Take 1 capsule by mouth daily.   Yes [provider]  valsartan  (DIOVAN ) 40 MG tablet Take 1 tablet (40 mg total) by mouth daily. 10/09/22  Yes Vicci Barnie NOVAK, MD  methocarbamol  (ROBAXIN ) 500 MG tablet Take 1 tablet (500 mg total) by mouth 2 (two) times  daily as needed for muscle spasms. 12/19/20   Vicci Barnie NOVAK, MD    Family History Family History  Problem Relation Age of Onset   Heart failure Mother    Diabetes Father    High blood pressure Father    Prostate cancer Father    Prostate cancer Maternal Grandfather    Stomach cancer Neg Hx    Esophageal cancer Neg Hx    Liver cancer Neg Hx     Social History Social History   Tobacco Use   Smoking status: Some Days    Types: Cigarettes   Smokeless tobacco: Never  Vaping Use   Vaping status: Never Used  Substance Use Topics   Alcohol use: Yes    Alcohol/week: 2.0 standard drinks of alcohol    Types: 2 Cans of beer per week   Drug use: No     Allergies   Milk-related compounds   Review of Systems Review of Systems  Musculoskeletal:  Positive for arthralgias and myalgias.       Left ankle pain      Physical Exam Triage Vital Signs ED Triage Vitals  Encounter Vitals Group     BP 07/10/23 1207 (!) 141/84     Girls Systolic BP Percentile --      Girls Diastolic BP Percentile --      Boys Systolic BP Percentile --      Boys Diastolic BP Percentile --  Pulse Rate 07/10/23 1207 82     Resp 07/10/23 1207 16     Temp 07/10/23 1207 97.6 F (36.4 C)     Temp Source 07/10/23 1207 Oral     SpO2 07/10/23 1207 98 %     Weight 07/10/23 1207 153 lb (69.4 kg)     Height 07/10/23 1207 6' 4 (1.93 m)     Head Circumference --      Peak Flow --      Pain Score 07/10/23 1216 8     Pain Loc --      Pain Education --      Exclude from Growth Chart --    No data found.  Updated Vital Signs BP (!) 141/84 (BP Location: Right Arm)   Pulse 82   Temp 97.6 F (36.4 C) (Oral)   Resp 16   Ht 6' 4 (1.93 m)   Wt 153 lb (69.4 kg)   SpO2 98%   BMI 18.62 kg/m   Visual Acuity Right Eye Distance:   Left Eye Distance:   Bilateral Distance:    Right Eye Near:   Left Eye Near:    Bilateral Near:     Physical Exam Vitals reviewed.  Constitutional:       General: He is awake.     Appearance: Normal appearance. He is well-developed and well-groomed.  HENT:     Head: Normocephalic and atraumatic.  Eyes:     Extraocular Movements: Extraocular movements intact.     Conjunctiva/sclera: Conjunctivae normal.  Pulmonary:     Effort: Pulmonary effort is normal.  Musculoskeletal:     Cervical back: Normal range of motion.     Left ankle: Swelling present. No deformity or ecchymosis. Tenderness present over the lateral malleolus. Normal range of motion. Anterior drawer test negative. Normal pulse.     Left Achilles Tendon: Normal. No tenderness or defects. Thompson's test negative.     Left foot: Normal range of motion and normal capillary refill. Swelling and tenderness present. No deformity or laceration. Normal pulse.     Comments: Patient has swelling and tenderness along the lateral malleolus extending to the dorsum of the left foot.  Dorsalis pedis and posterior tibialis pulses are intact, 2+ and brisk.  He has capillary refill less than 2 seconds at the distal aspect of several toes of the left foot. Patient has intact range of motion of the left ankle and foot with regards to dorsiflexion, plantarflexion, supination, pronation.  He is able to flex and extend toes without notable issue.  Neurological:     Mental Status: He is alert and oriented to person, place, and time.  Psychiatric:        Attention and Perception: Attention normal.        Mood and Affect: Mood normal.        Speech: Speech normal.        Behavior: Behavior normal. Behavior is cooperative.      UC Treatments / Results  Labs (all labs ordered are listed, but only abnormal results are displayed) Labs Reviewed - No data to display  EKG   Radiology DG Ankle Complete Left Result Date: 07/10/2023 CLINICAL DATA:  Trauma to the left ankle. EXAM: LEFT ANKLE COMPLETE - 3+ VIEW COMPARISON:  None Available. FINDINGS: There is no evidence of fracture, dislocation, or joint  effusion. There is no evidence of arthropathy or other focal bone abnormality. Soft tissues are unremarkable. IMPRESSION: Negative. Electronically Signed   By: Vanetta  Radparvar M.D.   On: 07/10/2023 12:55    Procedures Procedures (including critical care time)  Medications Ordered in UC Medications  ibuprofen  (ADVIL ) tablet 800 mg (800 mg Oral Given 07/10/23 1258)    Initial Impression / Assessment and Plan / UC Course  I have reviewed the triage vital signs and the nursing notes.  Pertinent labs & imaging results that were available during my care of the patient were reviewed by me and considered in my medical decision making (see chart for details).      Final Clinical Impressions(s) / UC Diagnoses   Final diagnoses:  Sprain of left ankle, unspecified ligament, initial encounter   Patient presents today with concerns for left ankle swelling and pain following stepping into a hole and twisting his ankle 2 days ago.  Physical exam is notable for swelling and tenderness along the lateral malleolus of the left ankle.  Patient has intact range of motion and appears to be neurovascularly intact.  Imaging results were negative for signs of acute fracture or dislocation per radiology interpretation.  Results were discussed with patient during appointment.  At this time I suspect likely sprain and recommend conservative measures.  Will provide patient with lace up ankle brace as well as crutches to assist with ambulation.  Recommend alternating Tylenol  and ibuprofen  as needed for pain.  Reviewed home measures and provided stretches to assist with recovery.  If symptoms are not improving or seem to be worsening recommend follow-up with orthopedics or return to urgent care for further evaluation.     Discharge Instructions      You were seen today for concerns of an ankle injury, pain and swelling Your imaging was negative for signs of fracture or dislocation after the radiologist reviewed the  xray.  Based on your symptoms and the injury you described, I suspect you likely have an ankle sprain.  We have supplied you with a brace to help stabilize your ankle while it is healing along with crutches to use as needed.  You can alternate Tylenol  and Ibuprofen  as needed for pain management As needed/ desired you can apply warm compresses to the area to help with recovery. Please do not leave this on for longer than 15 minutes and make sure you have at least 30 minutes without a compress to prevent skin burns or injury I have included some stretches and rehab exercises in your paperwork to help with your recovery. Please try to complete these one to two times per day to help resolve your injury.  If you feel like your symptoms are not improving or worsening over the next 1-2 weeks you can return to urgent care or follow up with Orthopedics or your PCP for ongoing management.       ED Prescriptions   None    PDMP not reviewed this encounter.   Marylene Rocky BRAVO, PA-C 07/10/23 2238

## 2023-07-10 NOTE — ED Triage Notes (Signed)
 Pt presents to UC for c/o left ankle injury 2 days ago. Pt states he stepped in a hole outside while it was raining and rolled ankle inward.

## 2023-07-10 NOTE — Discharge Instructions (Addendum)
 You were seen today for concerns of an ankle injury, pain and swelling Your imaging was negative for signs of fracture or dislocation after the radiologist reviewed the xray.  Based on your symptoms and the injury you described, I suspect you likely have an ankle sprain.  We have supplied you with a brace to help stabilize your ankle while it is healing along with crutches to use as needed.  You can alternate Tylenol  and Ibuprofen  as needed for pain management As needed/ desired you can apply warm compresses to the area to help with recovery. Please do not leave this on for longer than 15 minutes and make sure you have at least 30 minutes without a compress to prevent skin burns or injury I have included some stretches and rehab exercises in your paperwork to help with your recovery. Please try to complete these one to two times per day to help resolve your injury.  If you feel like your symptoms are not improving or worsening over the next 1-2 weeks you can return to urgent care or follow up with Orthopedics or your PCP for ongoing management.

## 2023-09-17 ENCOUNTER — Telehealth: Payer: Self-pay | Admitting: Internal Medicine

## 2023-09-17 NOTE — Telephone Encounter (Signed)
 Confirmed appt for 9/11

## 2023-09-18 ENCOUNTER — Encounter: Payer: Self-pay | Admitting: Internal Medicine

## 2023-09-18 ENCOUNTER — Ambulatory Visit: Admitting: Internal Medicine

## 2023-09-18 VITALS — BP 124/78 | HR 75 | Ht 76.0 in | Wt 140.2 lb

## 2023-09-18 DIAGNOSIS — Z5321 Procedure and treatment not carried out due to patient leaving prior to being seen by health care provider: Secondary | ICD-10-CM

## 2023-09-18 NOTE — Progress Notes (Signed)
 Pt left before being seen. Will have scheduler reach out to see if he wants to reschedule.

## 2023-09-19 ENCOUNTER — Telehealth: Payer: Self-pay | Admitting: Internal Medicine

## 2023-09-19 NOTE — Telephone Encounter (Signed)
 Called patient, No answer. Left voicemail.

## 2023-09-19 NOTE — Telephone Encounter (Signed)
-----   Message from Barnie Louder sent at 09/18/2023  6:11 PM EDT ----- Regarding: Appt Pt left before being seen today. Please see if pt wants to reschedule.

## 2023-10-01 ENCOUNTER — Other Ambulatory Visit: Payer: Self-pay | Admitting: Internal Medicine

## 2023-10-01 DIAGNOSIS — I10 Essential (primary) hypertension: Secondary | ICD-10-CM

## 2023-10-25 ENCOUNTER — Other Ambulatory Visit: Payer: Self-pay | Admitting: Internal Medicine

## 2023-10-25 DIAGNOSIS — I10 Essential (primary) hypertension: Secondary | ICD-10-CM

## 2023-10-28 ENCOUNTER — Telehealth: Payer: Self-pay | Admitting: Internal Medicine

## 2023-10-28 NOTE — Telephone Encounter (Signed)
 Copied from CRM [id number]. Topic: Clinical - Prescription Issue >> Oct 28, 2023 12:06 PM Zebedee SAUNDERS wrote:  Reason for CRM:Pt called stated insurance requires a 90 day supply of valsartan  (DIOVAN ) 40 MG tablet to cover medication. Please make note for prescription.

## 2023-10-29 NOTE — Telephone Encounter (Signed)
 Called & spoke to the patient. Verified name & DOB. Informed that a 90 day supply of valsartan  was sent today to his pharmacy. Patient expressed verbal understanding.

## 2023-11-14 ENCOUNTER — Other Ambulatory Visit: Payer: Self-pay | Admitting: Internal Medicine

## 2023-11-14 DIAGNOSIS — I10 Essential (primary) hypertension: Secondary | ICD-10-CM

## 2023-11-14 NOTE — Telephone Encounter (Signed)
 Requested Prescriptions  Pending Prescriptions Disp Refills   amLODipine  (NORVASC ) 10 MG tablet [Pharmacy Med Name: AMLODIPINE  BESYLATE 10 MG TAB] 90 tablet 2    Sig: TAKE 1 TABLET BY MOUTH EVERY DAY     Cardiovascular: Calcium Channel Blockers 2 Failed - 11/14/2023  5:05 PM      Failed - Valid encounter within last 6 months    Recent Outpatient Visits           1 month ago Patient left before evaluation by physician   [facility] [place] Vicci Barnie NOVAK, MD   8 months ago Essential hypertension   [facility] [place] Vicci Barnie NOVAK, MD   1 year ago Essential hypertension   [facility] [place] Vicci Barnie NOVAK, MD   1 year ago Essential hypertension   [facility] [place] Vicci Barnie NOVAK, MD   2 years ago Essential hypertension   [facility] [place] Vicci Barnie B, MD              Passed - Last BP in normal range    BP Readings from Last 1 Encounters:  09/18/23 124/78         Passed - Last Heart Rate in normal range    Pulse Readings from Last 1 Encounters:  09/18/23 75          Courtesy refill. Patient will need an office visit for additional refills.

## 2023-11-27 ENCOUNTER — Telehealth: Payer: Self-pay

## 2023-11-27 NOTE — Telephone Encounter (Signed)
 Copied from CRM #[id number]. Topic: Clinical - Prescription Issue >> Nov 27, 2023  2:29 PM Antwanette L wrote: Reason for CRM: Patient called to report that he received only a 30-day supply of amlodipine  (Norvasc ) 10 mg tablet instead of the expected 90-day supply. The patient can be contacted at [phone]

## 2023-12-09 ENCOUNTER — Other Ambulatory Visit: Payer: Self-pay | Admitting: Internal Medicine

## 2023-12-09 DIAGNOSIS — I10 Essential (primary) hypertension: Secondary | ICD-10-CM
